# Patient Record
Sex: Male | Born: 1965 | Race: White | Hispanic: No | Marital: Married | Smoking: Former smoker
Health system: Southern US, Community
[De-identification: ages and names within clinical notes are randomized; demographics above are authoritative.]

## PROBLEM LIST (undated history)

## (undated) ENCOUNTER — Emergency Department: Discharge status: Absent without leave | Payer: Self-pay

## (undated) DIAGNOSIS — D499 Neoplasm of unspecified behavior of unspecified site: Secondary | ICD-10-CM

## (undated) DIAGNOSIS — R519 Headache, unspecified: Secondary | ICD-10-CM

## (undated) DIAGNOSIS — I10 Essential (primary) hypertension: Secondary | ICD-10-CM

## (undated) DIAGNOSIS — R51 Headache: Secondary | ICD-10-CM

## (undated) DIAGNOSIS — K219 Gastro-esophageal reflux disease without esophagitis: Secondary | ICD-10-CM

## (undated) DIAGNOSIS — I639 Cerebral infarction, unspecified: Secondary | ICD-10-CM

## (undated) DIAGNOSIS — Z8719 Personal history of other diseases of the digestive system: Secondary | ICD-10-CM

## (undated) DIAGNOSIS — M1990 Unspecified osteoarthritis, unspecified site: Secondary | ICD-10-CM

## (undated) DIAGNOSIS — Z8709 Personal history of other diseases of the respiratory system: Secondary | ICD-10-CM

## (undated) DIAGNOSIS — G4700 Insomnia, unspecified: Secondary | ICD-10-CM

## (undated) DIAGNOSIS — J45909 Unspecified asthma, uncomplicated: Secondary | ICD-10-CM

## (undated) DIAGNOSIS — M75100 Unspecified rotator cuff tear or rupture of unspecified shoulder, not specified as traumatic: Secondary | ICD-10-CM

## (undated) DIAGNOSIS — M19019 Primary osteoarthritis, unspecified shoulder: Secondary | ICD-10-CM

## (undated) HISTORY — PX: OTHER SURGICAL HISTORY: SHX169

## (undated) HISTORY — PX: CHOLECYSTECTOMY: SHX55

## (undated) HISTORY — PX: COLONOSCOPY: SHX174

---

## 2003-11-23 ENCOUNTER — Ambulatory Visit (HOSPITAL_BASED_OUTPATIENT_CLINIC_OR_DEPARTMENT_OTHER): Admission: RE | Admit: 2003-11-23 | Discharge: 2003-11-23 | Payer: Self-pay | Admitting: Orthopedic Surgery

## 2003-11-23 ENCOUNTER — Ambulatory Visit (HOSPITAL_COMMUNITY): Admission: RE | Admit: 2003-11-23 | Discharge: 2003-11-23 | Payer: Self-pay | Admitting: Orthopedic Surgery

## 2003-11-23 HISTORY — PX: SHOULDER ARTHROSCOPY: SHX128

## 2005-02-24 HISTORY — PX: TUMOR EXCISION: SHX421

## 2006-02-24 HISTORY — PX: HERNIA REPAIR: SHX51

## 2007-09-06 ENCOUNTER — Emergency Department (HOSPITAL_COMMUNITY): Admission: EM | Admit: 2007-09-06 | Discharge: 2007-09-06 | Payer: Self-pay | Admitting: Emergency Medicine

## 2009-03-14 ENCOUNTER — Ambulatory Visit (HOSPITAL_COMMUNITY): Admission: RE | Admit: 2009-03-14 | Discharge: 2009-03-14 | Payer: Self-pay | Admitting: Neurosurgery

## 2009-03-14 HISTORY — PX: LUMBAR LAMINECTOMY/DECOMPRESSION MICRODISCECTOMY: SHX5026

## 2010-05-07 ENCOUNTER — Ambulatory Visit
Admission: RE | Admit: 2010-05-07 | Discharge: 2010-05-07 | Disposition: A | Discharge status: Home / Self Care | Payer: BC Managed Care – PPO | Source: Ambulatory Visit | Attending: Family Medicine | Admitting: Family Medicine

## 2010-05-07 ENCOUNTER — Other Ambulatory Visit: Payer: Self-pay | Admitting: Family Medicine

## 2010-05-07 DIAGNOSIS — R52 Pain, unspecified: Secondary | ICD-10-CM

## 2010-05-10 ENCOUNTER — Other Ambulatory Visit (HOSPITAL_COMMUNITY): Payer: Self-pay | Admitting: Family Medicine

## 2010-05-10 DIAGNOSIS — M25552 Pain in left hip: Secondary | ICD-10-CM

## 2010-05-12 LAB — CBC
HCT: 45.3 % (ref 39.0–52.0)
Hemoglobin: 15.4 g/dL (ref 13.0–17.0)
RBC: 5.07 MIL/uL (ref 4.22–5.81)
WBC: 7.5 10*3/uL (ref 4.0–10.5)

## 2010-05-12 LAB — BASIC METABOLIC PANEL
Calcium: 9.7 mg/dL (ref 8.4–10.5)
GFR calc Af Amer: >60 mL/min (ref >60)
GFR calc non Af Amer: >60 mL/min (ref >60)
Potassium: 4.3 mEq/L (ref 3.5–5.1)
Sodium: 139 mEq/L (ref 135–145)

## 2010-05-17 ENCOUNTER — Encounter (HOSPITAL_COMMUNITY)
Admission: RE | Admit: 2010-05-17 | Discharge: 2010-05-17 | Disposition: A | Discharge status: Home / Self Care | Payer: BC Managed Care – PPO | Source: Ambulatory Visit | Attending: Family Medicine | Admitting: Family Medicine

## 2010-05-17 ENCOUNTER — Ambulatory Visit (HOSPITAL_COMMUNITY)
Admission: RE | Admit: 2010-05-17 | Discharge: 2010-05-17 | Disposition: A | Discharge status: Home / Self Care | Payer: BC Managed Care – PPO | Source: Ambulatory Visit | Attending: Family Medicine | Admitting: Family Medicine

## 2010-05-17 DIAGNOSIS — M899 Disorder of bone, unspecified: Secondary | ICD-10-CM | POA: Insufficient documentation

## 2010-05-17 DIAGNOSIS — M25551 Pain in right hip: Secondary | ICD-10-CM

## 2010-05-17 DIAGNOSIS — M25552 Pain in left hip: Secondary | ICD-10-CM

## 2010-05-17 MED ORDER — TECHNETIUM TC 99M MEDRONATE IV KIT
25.0000 | PACK | Freq: Once | INTRAVENOUS | Status: AC | PRN
  Administered 2010-05-17: 25 via INTRAVENOUS

## 2010-07-12 NOTE — Op Note (Signed)
NAMEOJANI, Scott Merritt              ACCOUNT NO.:  [id number]   MEDICAL RECORD NO.:  [id number]          PATIENT TYPE:  AMB   LOCATION:  DSC                          FACILITY:  [facility]   PHYSICIAN:  Katy Fitch. Sypher Montez Hageman., M.D.DATE OF BIRTH:  1965-04-13   DATE OF PROCEDURE:  11/23/2003  DATE OF DISCHARGE:                                 OPERATIVE REPORT   PREOPERATIVE DIAGNOSES:  1.  Chronic pain, right shoulder with presumed internal derangement, rule      out deep surface rotator cuff tear versus labral pathology.  2.  Chronic acromioclavicular joint degenerative arthritis with plain film      and MRI documentation of acromioclavicular joint degenerative change      with inferior projecting osteophytes causing stage II impingement, right      shoulder.   POSTOPERATIVE DIAGNOSES:  1.  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  2.  Chronic acromioclavicular joint arthropathy with stage II impingement.  3.  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   OPERATION PERFORMED:  1.  Diagnostic arthroscopy, right shoulder.  2.  Arthroscopic debridement of type 3 superior labral anterior posterior      lesion with partial debridement of biceps origin.  3.  Arthroscopic subacromial decompression with coracoacromial ligament      release, bursectomy and acromioplasty.  4.  Open resection of distal clavicle, i.e. Mumford procedure.   SURGEON:  Katy Fitch. Sypher, M.D.   ASSISTANT:  Jonni Sanger, P.A.   ANESTHESIA:  General endotracheal supplemented by interscalene block.   SUPERVISING ANESTHESIOLOGIST:  Germaine Pomfret, M.D.   INDICATIONS FOR PROCEDURE:  Scott Merritt is a 45 year old man who was  referred for evaluation and management of a painful right  shoulder.  He  reported a history of acutely straining his right shoulder, feeling a pop  and subsequently developing chronic crepitation and pain with rotation of  his shoulder for forward elevation.  Clinical examination suggested internal  derangement of the shoulder and plain films documented acromioclavicular  arthropathy and a type 2 acromion.  There was sclerosis of the greater  tuberosity suggestive of chronic impingement.  An MRI of the shoulder was  obtained at [facility].  This was interpreted to reveal significant edema  in the distal clavicle with acromioclavicular degenerative change.  There  were signs of chronic bursitis.  The biceps tendon was noted by the  radiologist to be normal.  Overread of the film suggested a possible  internal derangement and significant acromioclavicular arthropathy.  We  recommended diagnostic arthroscopy at this time with appropriate  intervention to follow.  Preoperatively, Mr. Silas was advised of the 47 potential risks and benefits of surgery including possible infection and  possible failure to resolve all the symptoms.  After informed consent during  a period when questions were invited and answered, he was brought to the  operating room  at this time.   DESCRIPTION OF PROCEDURE:  Karry Causer was brought to the operating room  and placed in supine position on the operating table.  Following  interscalene block in the holding area, anesthesia was satisfactory in the  right forequarter.  General orotracheal anesthesia was induced followed by  carefully positioning in the beach chair position with the aid of torso and  head holder designed for shoulder arthroscopy.  The entire right upper  extremity and forequarter were prepped with DuraPrep and draped with  impervious arthroscopy drapes.  The shoulder was distended with 20 mL of  sterile saline with a spinal needle brought in through the posterior portal  followed by placement of the  scope with blunt technique.  Diagnostic  arthroscopy revealed intact hyaline articular cartilage surfaces on the  humeral head and glenoid.  There was some partial thickness chondromalacia  of the central glenoid.  The biceps tendon was delaminated with about 60% of  the tendon involved in a type 3 SLAP lesion extending from 3 o'clock  anteriorly to approximately 9 o'clock posteriorly.  This was hanging free  within the joint and impinging between the humeral head and glenoid.  There  was moderate synovitis noted.  The deep surface of the rotator cuff was  inspected.  The tendons were found to have minimal degenerative change,  particularly the deep surface of the supraspinatus.  The subscapularis and  infraspinatus appeared normal.   An anterior portal was created under direct vision followed by use of a  suction shaver to debride the delaminated biceps tendon and degenerative  SLAP lesion.  A synovectomy was accomplished followed by hemostasis with  bipolar cautery.  The deep surface of the rotator cuff was debrided followed  by photographic documentation of its condition.  The arthroscopic equipment  was then removed from the glenohumeral joint and placed in the subacromial  space.  A florid bursitis was noted.  After bursectomy was accomplished, the  coracoacromial ligament was released with the cutting cautery followed by  release of the capsule of the acromioclavicular joint.  The distal clavicle  was quite degenerative.  The acromion was leveled to a type 1 morphology  with a suction bur followed by resection of the coracoacromial ligament over  a distance of approximately 1 cm.  Hemostasis was achieved with bipolar  cautery.  After bursectomy the cuff was inspected and found to be intact on  its bursal surface.  The scope was removed and we proceeded to resect the  distal 15 mm of clavicle through a 2 cm incision directly over the distal clavicle.  The clavicle was exposed  subperiosteally with release of the  capsular ligaments of the acromioclavicular joint.   The distal clavicle was severely degenerative.  Following satisfactory  resection of the clavicle, the muscle of the anterior trapezius and anterior  third of the deltoid was repaired with mattress suture of #2 FiberWire  closing the dead space and reinforcing the deltoid origin.  The wound was  then repaired with subdermal sutures of 2-0 Vicryl and intradermal 3-0  Prolene.  The scope was placed in the subacromial space and used to evacuate  hematoma followed by irrigation to remove all bone scrap.  Hemostasis was  achieved followed by removal of the arthroscopic equipment and closure of  the portals with a mattress suture of 3-0 Prolene.  There were no apparent  complications.  Mr. Matsuo tolerated surgery and anesthesia well.  The  patient was then transferred  to the recovery room with stable vital signs.  He will be discharged to the care of his family with prescriptions for  Dilaudid 2 mg one or two tablets by mouth every four to six hours as needed  for pain, Keflex 500 mg one by mouth every eight hours times four days as a  prophylactic  and Motrin 600 mg one by mouth every six hours as needed for  pain, 30 tablets without refill.       RVS/MEDQ  D:  11/23/2003  T:  11/23/2003  Job:  [id number]

## 2010-08-20 ENCOUNTER — Other Ambulatory Visit: Payer: Self-pay | Admitting: Neurosurgery

## 2010-08-20 DIAGNOSIS — M25551 Pain in right hip: Secondary | ICD-10-CM

## 2010-08-21 ENCOUNTER — Ambulatory Visit
Admission: RE | Admit: 2010-08-21 | Discharge: 2010-08-21 | Disposition: A | Discharge status: Home / Self Care | Payer: BC Managed Care – PPO | Source: Ambulatory Visit | Attending: Neurosurgery | Admitting: Neurosurgery

## 2010-08-21 DIAGNOSIS — M25551 Pain in right hip: Secondary | ICD-10-CM

## 2010-09-26 ENCOUNTER — Other Ambulatory Visit: Payer: Self-pay | Admitting: Neurological Surgery

## 2010-09-26 DIAGNOSIS — M5410 Radiculopathy, site unspecified: Secondary | ICD-10-CM

## 2010-10-02 ENCOUNTER — Ambulatory Visit
Admission: RE | Admit: 2010-10-02 | Discharge: 2010-10-02 | Disposition: A | Discharge status: Home / Self Care | Payer: BC Managed Care – PPO | Source: Ambulatory Visit | Attending: Neurological Surgery | Admitting: Neurological Surgery

## 2010-10-02 ENCOUNTER — Ambulatory Visit
Admission: RE | Admit: 2010-10-02 | Discharge: 2010-10-02 | Disposition: A | Discharge status: Home / Self Care | Payer: Self-pay | Source: Ambulatory Visit | Attending: Neurological Surgery | Admitting: Neurological Surgery

## 2010-10-02 DIAGNOSIS — M549 Dorsalgia, unspecified: Secondary | ICD-10-CM

## 2010-10-02 DIAGNOSIS — M5410 Radiculopathy, site unspecified: Secondary | ICD-10-CM

## 2010-10-02 MED ORDER — DIAZEPAM 2 MG PO TABS
10.0000 mg | ORAL_TABLET | Freq: Once | ORAL | Status: AC
  Administered 2010-10-02: 10 mg via ORAL

## 2010-10-02 MED ORDER — SODIUM CHLORIDE 0.9 % IV SOLN: 4.0000 mg | Freq: Four times a day (QID) | INTRAVENOUS | Status: DC | PRN

## 2010-10-02 MED ORDER — IOHEXOL 180 MG/ML  SOLN
16.0000 mL | Freq: Once | INTRAMUSCULAR | Status: AC | PRN
  Administered 2010-10-02: 16 mL via INTRATHECAL

## 2010-10-02 MED ORDER — OXYCODONE-ACETAMINOPHEN 5-325 MG PO TABS
2.0000 | ORAL_TABLET | Freq: Once | ORAL | Status: AC
  Administered 2010-10-02: 2 via ORAL

## 2010-10-02 NOTE — Discharge Instructions (Signed)

## 2010-10-02 NOTE — Progress Notes (Signed)
Resting quietly in nursing station waiting for CT.  Medicated for pain.  Eating snack.

## 2010-10-04 ENCOUNTER — Telehealth: Payer: Self-pay | Admitting: Interventional Radiology

## 2010-10-04 ENCOUNTER — Ambulatory Visit
Admission: RE | Admit: 2010-10-04 | Discharge: 2010-10-04 | Disposition: A | Discharge status: Home / Self Care | Payer: BC Managed Care – PPO | Source: Ambulatory Visit | Attending: Neurological Surgery | Admitting: Neurological Surgery

## 2010-10-04 ENCOUNTER — Other Ambulatory Visit: Payer: Self-pay | Admitting: Neurological Surgery

## 2010-10-04 DIAGNOSIS — G971 Other reaction to spinal and lumbar puncture: Secondary | ICD-10-CM

## 2010-10-04 MED ORDER — METHYLPREDNISOLONE ACETATE 40 MG/ML INJ SUSP (RADIOLOG: 120.0000 mg | Freq: Once | INTRAMUSCULAR | Status: DC

## 2010-10-04 MED ORDER — IOHEXOL 180 MG/ML  SOLN
1.0000 mL | Freq: Once | INTRAMUSCULAR | Status: AC | PRN
  Administered 2010-10-04: 1 mL via EPIDURAL

## 2010-10-04 NOTE — Telephone Encounter (Signed)
Pt c/lo post procedure headache that started last evening. Myelo on 8/8, excuse sent to pt's place of work for him to go home and be on bedrest for the next 24-48 hours, sent to [facility] [phone]). Explained that we could do bloodpatch  after getting an order from his dr. But he would still not be able to work sat, he chose to go home and bedrest all weekend.

## 2010-10-04 NOTE — Progress Notes (Signed)
20 cc blood drawn from right AC space w/o difficulty for blood patch.  jkl

## 2010-10-10 ENCOUNTER — Other Ambulatory Visit: Payer: Self-pay | Admitting: Neurosurgery

## 2010-10-10 DIAGNOSIS — I739 Peripheral vascular disease, unspecified: Secondary | ICD-10-CM

## 2010-10-11 ENCOUNTER — Other Ambulatory Visit: Payer: BC Managed Care – PPO

## 2010-10-14 ENCOUNTER — Other Ambulatory Visit: Payer: BC Managed Care – PPO

## 2010-11-21 LAB — CBC
HCT: 53.6 — ABNORMAL HIGH
Hemoglobin: 18.1 — ABNORMAL HIGH
MCHC: 33.7
MCV: 86.6
RDW: 13.4

## 2010-11-21 LAB — URINALYSIS, ROUTINE W REFLEX MICROSCOPIC
Ketones, ur: 15 — AB
Leukocytes, UA: NEGATIVE
Nitrite: NEGATIVE
Protein, ur: 30 — AB
Urobilinogen, UA: 0.2

## 2010-11-21 LAB — COMPREHENSIVE METABOLIC PANEL
Alkaline Phosphatase: 80
BUN: 18
Chloride: 101
Glucose, Bld: 112 — ABNORMAL HIGH
Potassium: 3.9
Total Bilirubin: 1.2
Total Protein: 8.4 — ABNORMAL HIGH

## 2010-11-21 LAB — POCT I-STAT, CHEM 8
Calcium, Ion: 1.09 — ABNORMAL LOW
Chloride: 107
Glucose, Bld: 122 — ABNORMAL HIGH
HCT: 57 — ABNORMAL HIGH

## 2010-11-21 LAB — DIFFERENTIAL
Basophils Absolute: 0.1
Eosinophils Absolute: 0.1
Eosinophils Relative: 1
Lymphocytes Relative: 12
Monocytes Absolute: 0.4

## 2010-11-21 LAB — LIPASE, BLOOD: Lipase: 91 — ABNORMAL HIGH

## 2012-03-27 DIAGNOSIS — I639 Cerebral infarction, unspecified: Secondary | ICD-10-CM

## 2012-03-27 HISTORY — DX: Cerebral infarction, unspecified: I63.9

## 2012-11-08 ENCOUNTER — Other Ambulatory Visit: Payer: Self-pay | Admitting: Orthopedic Surgery

## 2012-11-08 DIAGNOSIS — M25512 Pain in left shoulder: Secondary | ICD-10-CM

## 2012-11-15 ENCOUNTER — Ambulatory Visit
Admission: RE | Admit: 2012-11-15 | Discharge: 2012-11-15 | Disposition: A | Discharge status: Home / Self Care | Payer: BC Managed Care – PPO | Source: Ambulatory Visit | Attending: Orthopedic Surgery | Admitting: Orthopedic Surgery

## 2012-11-15 DIAGNOSIS — M25512 Pain in left shoulder: Secondary | ICD-10-CM

## 2012-11-24 DIAGNOSIS — M19019 Primary osteoarthritis, unspecified shoulder: Secondary | ICD-10-CM

## 2012-11-24 DIAGNOSIS — M75100 Unspecified rotator cuff tear or rupture of unspecified shoulder, not specified as traumatic: Secondary | ICD-10-CM

## 2012-11-24 HISTORY — DX: Primary osteoarthritis, unspecified shoulder: M19.019

## 2012-11-24 HISTORY — DX: Unspecified rotator cuff tear or rupture of unspecified shoulder, not specified as traumatic: M75.100

## 2012-11-25 ENCOUNTER — Encounter (HOSPITAL_BASED_OUTPATIENT_CLINIC_OR_DEPARTMENT_OTHER): Payer: Self-pay | Admitting: *Deleted

## 2012-11-25 NOTE — Pre-Procedure Instructions (Signed)
To come for BMET; EKG and echo req. from [facility].

## 2012-11-29 ENCOUNTER — Encounter (HOSPITAL_BASED_OUTPATIENT_CLINIC_OR_DEPARTMENT_OTHER)
Admission: RE | Admit: 2012-11-29 | Discharge: 2012-11-29 | Disposition: A | Discharge status: Home / Self Care | Payer: BC Managed Care – PPO | Source: Ambulatory Visit | Attending: Orthopedic Surgery | Admitting: Orthopedic Surgery

## 2012-11-29 LAB — BASIC METABOLIC PANEL
BUN: 14 mg/dL (ref 6–23)
Chloride: 100 mEq/L (ref 96–112)
GFR calc Af Amer: >90 mL/min (ref >90)
Glucose, Bld: 134 mg/dL — ABNORMAL HIGH (ref 70–99)
Potassium: 3.5 mEq/L (ref 3.5–5.1)

## 2012-11-30 ENCOUNTER — Other Ambulatory Visit: Payer: Self-pay | Admitting: Orthopedic Surgery

## 2012-12-01 NOTE — H&P (Signed)
Attilio Zeitler is an 47 y.o. male.   Chief Complaint: c/o chronic and progressive left shoulder pain and decreased ROM HPI: Scott Merritt presents for evaluation of a painful and weak left shoulder. Rondal is a well known patient who is s/p right shoulder arthroscopy and attention to his rotator cuff. He now presents with a 4 month history of progressive pain and weakness of his left shoulder. He has had a difficult year. In February he had an episode of scotomata followed by numbness and altered sensation on the left side of his body. He was seen at [facility] by Dr. Janan Halter in the department of neurology. He was noted to have small vessel disease and imaging that documented 2 cerebral vascular accidents. He is currently being treated with statins and aspirin 325 mg daily. He has a history of hypertension. He has a strong family history of cerebral vascular accidents with his father and grandfather experiencing CVA's.    Past Medical History  Diagnosis Date  . Stroke 03/2012    residual balance problems and memory problems  . Arthritis     left shoulder, hands  . Hypertension     under control with meds., has been on med. x 2 yr.  . Asthma     rare use of neb., none in > 1 yr.  . Rotator cuff tear 11/2012    left  . 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 History of bronchitis     Past Surgical History  Procedure Laterality Date  . Shoulder arthroscopy Right 11/23/2003  . Lumbar laminectomy/decompression microdiscectomy Right 03/14/2009    L5-S1  . 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 Hernia repair  2008    History reviewed. No pertinent family history. Social History:  reports that he has never smoked. His smokeless tobacco use includes Chew. He reports that he does not drink alcohol or use illicit drugs.  Allergies:  Allergies  Allergen Reactions  . Prednisone Other (See Comments)    BLURRED VISION  . Sudafed [Pseudoephedrine Hcl] Hives    No  prescriptions prior to admission    No results found for this or any previous visit (from the past 48 hour(s)).  No results found.   Pertinent items are noted in HPI.  Height 5' 10.5" (1.791 m), weight 102.059 kg (225 lb).  General appearance: alert Head: Normocephalic, without obvious abnormality Neck: supple, symmetrical, trachea midline Resp: clear to auscultation bilaterally Cardio: regular rate and rhythm GI: normal findings: bowel sounds normal Extremities: He has a normal gait and stance. He appears to have a normal affect and is answering questions with appropriate content. He has shoulder elevation 175 right and 150 left, external rotation at 90 degrees abduction 95 right and 60 left. He can internally rotate both shoulders to T10. He lacks 20 degrees of extension in the scapular plane on the left vs 0 on the right.   He has weakness of abduction scaption external rotation.   Plain films of the shoulder demonstrate degenerative change at the [facility] joint, an unfavorable acromial type II morphology. He does not have significant glenohumeral degenerative arthritis or considerable remodeling at the greater tuberosity.  MRI of his left shoulder:  this reveals a type II acromion, AC degenerative change and a bursal side rotator cuff tear. This is virtually identical to his right side predicament.    Pulses: 2+ and symmetric Skin: normal Neurologic: Grossly normal    Assessment/Plan Impression:  Left shoulder RC tear with impingement syndrome and bursal side RC tear  Plan: To the OR for left SA with SAD/DCR and RC repair as needed.The procedure, risks,benefits and post-op course were discussed with the patient at length and they were in agreement with the plan.  DASNOIT,Shonnie Poudrier J 12/01/2012, 1:19 PM   H&P documentation: 12/02/2012  -History and Physical Reviewed  -Patient has been re-examined  -No change in the plan of care  Wyn Forster, MD

## 2012-12-02 ENCOUNTER — Encounter (HOSPITAL_BASED_OUTPATIENT_CLINIC_OR_DEPARTMENT_OTHER): Payer: BC Managed Care – PPO | Admitting: Anesthesiology

## 2012-12-02 ENCOUNTER — Ambulatory Visit (HOSPITAL_BASED_OUTPATIENT_CLINIC_OR_DEPARTMENT_OTHER)
Admission: RE | Admit: 2012-12-02 | Discharge: 2012-12-02 | Disposition: A | Discharge status: Home / Self Care | Payer: BC Managed Care – PPO | Source: Ambulatory Visit | Attending: Orthopedic Surgery | Admitting: Orthopedic Surgery

## 2012-12-02 ENCOUNTER — Ambulatory Visit (HOSPITAL_BASED_OUTPATIENT_CLINIC_OR_DEPARTMENT_OTHER): Payer: BC Managed Care – PPO | Admitting: Anesthesiology

## 2012-12-02 ENCOUNTER — Encounter (HOSPITAL_BASED_OUTPATIENT_CLINIC_OR_DEPARTMENT_OTHER)
Admission: RE | Disposition: A | Discharge status: Home / Self Care | Payer: Self-pay | Source: Ambulatory Visit | Attending: Orthopedic Surgery

## 2012-12-02 ENCOUNTER — Encounter (HOSPITAL_BASED_OUTPATIENT_CLINIC_OR_DEPARTMENT_OTHER): Payer: Self-pay | Admitting: *Deleted

## 2012-12-02 DIAGNOSIS — I10 Essential (primary) hypertension: Secondary | ICD-10-CM | POA: Insufficient documentation

## 2012-12-02 DIAGNOSIS — M25819 Other specified joint disorders, unspecified shoulder: Secondary | ICD-10-CM | POA: Insufficient documentation

## 2012-12-02 DIAGNOSIS — M7500 Adhesive capsulitis of unspecified shoulder: Secondary | ICD-10-CM | POA: Insufficient documentation

## 2012-12-02 DIAGNOSIS — Z01812 Encounter for preprocedural laboratory examination: Secondary | ICD-10-CM | POA: Insufficient documentation

## 2012-12-02 DIAGNOSIS — X58XXXA Exposure to other specified factors, initial encounter: Secondary | ICD-10-CM | POA: Insufficient documentation

## 2012-12-02 DIAGNOSIS — S43439A Superior glenoid labrum lesion of unspecified shoulder, initial encounter: Secondary | ICD-10-CM | POA: Insufficient documentation

## 2012-12-02 DIAGNOSIS — M719 Bursopathy, unspecified: Secondary | ICD-10-CM | POA: Insufficient documentation

## 2012-12-02 DIAGNOSIS — Z8673 Personal history of transient ischemic attack (TIA), and cerebral infarction without residual deficits: Secondary | ICD-10-CM | POA: Insufficient documentation

## 2012-12-02 DIAGNOSIS — M67919 Unspecified disorder of synovium and tendon, unspecified shoulder: Secondary | ICD-10-CM | POA: Insufficient documentation

## 2012-12-02 DIAGNOSIS — M24119 Other articular cartilage disorders, unspecified shoulder: Secondary | ICD-10-CM | POA: Insufficient documentation

## 2012-12-02 DIAGNOSIS — Z5333 Arthroscopic surgical procedure converted to open procedure: Secondary | ICD-10-CM | POA: Insufficient documentation

## 2012-12-02 HISTORY — DX: Unspecified osteoarthritis, unspecified site: M19.90

## 2012-12-02 HISTORY — DX: Unspecified asthma, uncomplicated: J45.909

## 2012-12-02 HISTORY — DX: Primary osteoarthritis, unspecified shoulder: M19.019

## 2012-12-02 HISTORY — DX: Cerebral infarction, unspecified: I63.9

## 2012-12-02 HISTORY — DX: Essential (primary) hypertension: I10

## 2012-12-02 HISTORY — DX: Personal history of other diseases of the respiratory system: Z87.09

## 2012-12-02 HISTORY — PX: SHOULDER ARTHROSCOPY WITH ROTATOR CUFF REPAIR AND SUBACROMIAL DECOMPRESSION: SHX5686

## 2012-12-02 HISTORY — DX: Unspecified rotator cuff tear or rupture of unspecified shoulder, not specified as traumatic: M75.100

## 2012-12-02 SURGERY — SHOULDER ARTHROSCOPY WITH ROTATOR CUFF REPAIR AND SUBACROMIAL DECOMPRESSION
Anesthesia: General | Site: Shoulder | Laterality: Left | Wound class: Clean

## 2012-12-02 MED ORDER — FENTANYL CITRATE 0.05 MG/ML IJ SOLN
50.0000 ug | INTRAMUSCULAR | Status: DC | PRN
  Administered 2012-12-02: 100 ug via INTRAVENOUS

## 2012-12-02 MED ORDER — CEFAZOLIN SODIUM-DEXTROSE 2-3 GM-% IV SOLR
2.0000 g | Freq: Once | INTRAVENOUS | Status: AC
  Administered 2012-12-02: 2 g via INTRAVENOUS

## 2012-12-02 MED ORDER — OXYCODONE HCL 5 MG/5ML PO SOLN: 5.0000 mg | Freq: Once | ORAL | Status: DC | PRN

## 2012-12-02 MED ORDER — LIDOCAINE HCL 1 % IJ SOLN
INTRAMUSCULAR | Status: DC | PRN
  Administered 2012-12-02: 2 mL via INTRADERMAL

## 2012-12-02 MED ORDER — MIDAZOLAM HCL 2 MG/2ML IJ SOLN
1.0000 mg | INTRAMUSCULAR | Status: DC | PRN
  Administered 2012-12-02: 2 mg via INTRAVENOUS

## 2012-12-02 MED ORDER — LIDOCAINE HCL (CARDIAC) 20 MG/ML IV SOLN
INTRAVENOUS | Status: DC | PRN
  Administered 2012-12-02: 50 mg via INTRAVENOUS

## 2012-12-02 MED ORDER — OXYCODONE HCL 5 MG PO TABS: 5.0000 mg | ORAL_TABLET | Freq: Once | ORAL | Status: DC | PRN

## 2012-12-02 MED ORDER — ROPIVACAINE HCL 5 MG/ML IJ SOLN
INTRAMUSCULAR | Status: DC | PRN
  Administered 2012-12-02: 25 mL

## 2012-12-02 MED ORDER — FENTANYL CITRATE 0.05 MG/ML IJ SOLN
INTRAMUSCULAR | Status: DC | PRN
  Administered 2012-12-02: 100 ug via INTRAVENOUS

## 2012-12-02 MED ORDER — HYDROMORPHONE HCL PF 1 MG/ML IJ SOLN
0.2500 mg | INTRAMUSCULAR | Status: DC | PRN
  Administered 2012-12-02: 0.5 mg via INTRAVENOUS

## 2012-12-02 MED ORDER — DEXAMETHASONE SODIUM PHOSPHATE 4 MG/ML IJ SOLN
INTRAMUSCULAR | Status: DC | PRN
  Administered 2012-12-02: 8 mg via INTRAVENOUS

## 2012-12-02 MED ORDER — HYDROMORPHONE HCL 2 MG PO TABS: ORAL_TABLET | ORAL | Status: DC

## 2012-12-02 MED ORDER — ONDANSETRON HCL 4 MG/2ML IJ SOLN
INTRAMUSCULAR | Status: DC | PRN
  Administered 2012-12-02: 4 mg via INTRAMUSCULAR

## 2012-12-02 MED ORDER — METOCLOPRAMIDE HCL 5 MG/ML IJ SOLN: 10.0000 mg | Freq: Once | INTRAMUSCULAR | Status: DC | PRN

## 2012-12-02 MED ORDER — SODIUM CHLORIDE 0.9 % IR SOLN
Status: DC | PRN
  Administered 2012-12-02: 5000 mL

## 2012-12-02 MED ORDER — PROPOFOL 10 MG/ML IV BOLUS
INTRAVENOUS | Status: DC | PRN
  Administered 2012-12-02: 200 mg via INTRAVENOUS

## 2012-12-02 MED ORDER — SUCCINYLCHOLINE CHLORIDE 20 MG/ML IJ SOLN
INTRAMUSCULAR | Status: DC | PRN
  Administered 2012-12-02: 100 mg via INTRAVENOUS

## 2012-12-02 MED ORDER — MIDAZOLAM HCL 2 MG/ML PO SYRP: 12.0000 mg | ORAL_SOLUTION | Freq: Once | ORAL | Status: DC | PRN

## 2012-12-02 MED ORDER — CEPHALEXIN 500 MG PO CAPS: 500.0000 mg | ORAL_CAPSULE | Freq: Three times a day (TID) | ORAL | Status: DC

## 2012-12-02 MED ORDER — LACTATED RINGERS IV SOLN
INTRAVENOUS | Status: DC
  Administered 2012-12-02 (×2): via INTRAVENOUS

## 2012-12-02 MED ORDER — LABETALOL HCL 5 MG/ML IV SOLN
INTRAVENOUS | Status: DC | PRN
  Administered 2012-12-02 (×2): 5 mg via INTRAVENOUS

## 2012-12-02 SURGICAL SUPPLY — 79 items
ANCHOR PEEK 4.75X19.1 SWLK C (Anchor) ×2 IMPLANT
BANDAGE ADHESIVE 1X3 (GAUZE/BANDAGES/DRESSINGS) IMPLANT
BLADE AVERAGE 25X9 (BLADE) IMPLANT
BLADE CUTTER MENIS 5.5 (BLADE) IMPLANT
BLADE SURG 15 STRL LF DISP TIS (BLADE) ×2 IMPLANT
BLADE SURG 15 STRL SS (BLADE) ×2
BUR EGG 3PK/BX (BURR) IMPLANT
BUR OVAL 6.0 (BURR) ×2 IMPLANT
CANISTER OMNI JUG 16 LITER (MISCELLANEOUS) ×4 IMPLANT
CANISTER SUCTION 2500CC (MISCELLANEOUS) IMPLANT
CANNULA TWIST IN 8.25X7CM (CANNULA) IMPLANT
CLEANER CAUTERY TIP 5X5 PAD (MISCELLANEOUS) IMPLANT
CLOTH BEACON ORANGE TIMEOUT ST (SAFETY) IMPLANT
CUTTER MENISCUS  4.2MM (BLADE) ×1
CUTTER MENISCUS 4.2MM (BLADE) ×1 IMPLANT
DECANTER SPIKE VIAL GLASS SM (MISCELLANEOUS) IMPLANT
DRAPE INCISE IOBAN 66X45 STRL (DRAPES) ×2 IMPLANT
DRAPE STERI 35X30 U-POUCH (DRAPES) ×2 IMPLANT
DRAPE SURG 17X23 STRL (DRAPES) ×2 IMPLANT
DRAPE U-SHAPE 47X51 STRL (DRAPES) ×2 IMPLANT
DRAPE U-SHAPE 76X120 STRL (DRAPES) ×4 IMPLANT
DRSG PAD ABDOMINAL 8X10 ST (GAUZE/BANDAGES/DRESSINGS) ×2 IMPLANT
DURAPREP 26ML APPLICATOR (WOUND CARE) ×2 IMPLANT
ELECT REM PT RETURN 9FT ADLT (ELECTROSURGICAL) ×2
ELECTRODE REM PT RTRN 9FT ADLT (ELECTROSURGICAL) ×1 IMPLANT
GLOVE BIOGEL M STRL SZ7.5 (GLOVE) ×2 IMPLANT
GLOVE BIOGEL PI IND STRL 7.0 (GLOVE) ×2 IMPLANT
GLOVE BIOGEL PI IND STRL 8 (GLOVE) ×2 IMPLANT
GLOVE BIOGEL PI INDICATOR 7.0 (GLOVE) ×2
GLOVE BIOGEL PI INDICATOR 8 (GLOVE) ×2
GLOVE ECLIPSE 6.5 STRL STRAW (GLOVE) ×2 IMPLANT
GLOVE ORTHO TXT STRL SZ7.5 (GLOVE) ×6 IMPLANT
GOWN BRE IMP PREV XXLGXLNG (GOWN DISPOSABLE) ×2 IMPLANT
GOWN STRL REIN 2XL XLG LVL4 (GOWN DISPOSABLE) ×2 IMPLANT
IV NS IRRIG 3000ML ARTHROMATIC (IV SOLUTION) ×4 IMPLANT
NDL SUT 6 .5 CRC .975X.05 MAYO (NEEDLE) ×1 IMPLANT
NEEDLE MAYO TAPER (NEEDLE) ×1
NEEDLE MINI RC 24MM (NEEDLE) IMPLANT
NEEDLE SCORPION (NEEDLE) ×2 IMPLANT
PACK ARTHROSCOPY DSU (CUSTOM PROCEDURE TRAY) ×2 IMPLANT
PACK BASIN DAY SURGERY FS (CUSTOM PROCEDURE TRAY) ×2 IMPLANT
PAD CLEANER CAUTERY TIP 5X5 (MISCELLANEOUS)
PASSER SUT SWANSON 36MM LOOP (INSTRUMENTS) IMPLANT
PENCIL BUTTON HOLSTER BLD 10FT (ELECTRODE) ×2 IMPLANT
SLEEVE SCD COMPRESS KNEE MED (MISCELLANEOUS) ×2 IMPLANT
SLING ARM FOAM STRAP LRG (SOFTGOODS) IMPLANT
SLING ARM FOAM STRAP MED (SOFTGOODS) IMPLANT
SLING ARM FOAM STRAP XLG (SOFTGOODS) ×2 IMPLANT
SPONGE GAUZE 4X4 12PLY (GAUZE/BANDAGES/DRESSINGS) ×2 IMPLANT
SPONGE LAP 4X18 X RAY DECT (DISPOSABLE) ×4 IMPLANT
STRIP CLOSURE SKIN 1/2X4 (GAUZE/BANDAGES/DRESSINGS) ×2 IMPLANT
SUCTION FRAZIER TIP 10 FR DISP (SUCTIONS) IMPLANT
SUT ETHIBOND 2 OS 4 DA (SUTURE) IMPLANT
SUT ETHILON 4 0 PS 2 18 (SUTURE) IMPLANT
SUT FIBERWIRE #2 38 T-5 BLUE (SUTURE) ×4
SUT FIBERWIRE 3-0 18 TAPR NDL (SUTURE)
SUT PROLENE 1 CT (SUTURE) IMPLANT
SUT PROLENE 3 0 PS 2 (SUTURE) ×2 IMPLANT
SUT TIGER TAPE 7 IN WHITE (SUTURE) IMPLANT
SUT VIC AB 0 CT1 27 (SUTURE)
SUT VIC AB 0 CT1 27XBRD ANBCTR (SUTURE) IMPLANT
SUT VIC AB 0 SH 27 (SUTURE) ×2 IMPLANT
SUT VIC AB 2-0 SH 27 (SUTURE) ×1
SUT VIC AB 2-0 SH 27XBRD (SUTURE) ×1 IMPLANT
SUT VIC AB 3-0 SH 27 (SUTURE)
SUT VIC AB 3-0 SH 27X BRD (SUTURE) IMPLANT
SUT VIC AB 3-0 X1 27 (SUTURE) IMPLANT
SUTURE FIBERWR #2 38 T-5 BLUE (SUTURE) ×2 IMPLANT
SUTURE FIBERWR 3-0 18 TAPR NDL (SUTURE) IMPLANT
SYR 3ML 23GX1 SAFETY (SYRINGE) IMPLANT
SYR BULB 3OZ (MISCELLANEOUS) IMPLANT
TAPE FIBER 2MM 7IN #2 BLUE (SUTURE) IMPLANT
TAPE PAPER 3X10 WHT MICROPORE (GAUZE/BANDAGES/DRESSINGS) ×2 IMPLANT
TOWEL OR 17X24 6PK STRL BLUE (TOWEL DISPOSABLE) ×2 IMPLANT
TUBE CONNECTING 20X1/4 (TUBING) ×2 IMPLANT
TUBING ARTHROSCOPY IRRIG 16FT (MISCELLANEOUS) ×2 IMPLANT
WAND STAR VAC 90 (SURGICAL WAND) ×2 IMPLANT
WATER STERILE IRR 1000ML POUR (IV SOLUTION) ×2 IMPLANT
YANKAUER SUCT BULB TIP NO VENT (SUCTIONS) ×2 IMPLANT

## 2012-12-02 NOTE — Brief Op Note (Signed)
12/02/2012  3:16 PM  PATIENT:  Scott Merritt  48 y.o. male  PRE-OPERATIVE DIAGNOSIS:  LEFT CUFF TEAR (bursal side per MRI), unfavorable acromial contour  POST-OPERATIVE DIAGNOSIS:  Left Shoulder Type 4 SLAP Tear, degenerative cuff tear of anterior supraspinatus, unfavorable acromial contour  PROCEDURE:  Procedure(s): LEFT SHOULDER ARTHROSCOPY WITH SUBACROMIAL DECOMPRESSION, OPEN BICEPS TENODESIS, REPAIR OF PITA TEAR, DEBRIDEMENT OF SLAP TEAR (Left)  SURGEON:  Surgeon(s) and Role:    * Wyn Forster., MD - Primary  PHYSICIAN ASSISTANT:   ASSISTANTS: Mallory Shirk.A-C   ANESTHESIA:   general  EBL:  Total I/O In: 1000 [I.V.:1000] Out: -   BLOOD ADMINISTERED:none  DRAINS: none   LOCAL MEDICATIONS USED:  ROPIVICAINE PLEXUS BLOCK  SPECIMEN:  No Specimen  DISPOSITION OF SPECIMEN:  N/A  COUNTS:  YES  TOURNIQUET:  * No tourniquets in log *  DICTATION: .Other Dictation: Dictation Number [id number]  PLAN OF CARE: Discharge to home after PACU  PATIENT DISPOSITION:  PACU - hemodynamically stable.   Delay start of Pharmacological VTE agent (>24hrs) due to surgical blood loss or risk of bleeding: not applicable

## 2012-12-02 NOTE — Anesthesia Preprocedure Evaluation (Signed)
Anesthesia Evaluation  Patient identified by MRN, date of birth, ID band Patient awake    Reviewed: Allergy & Precautions, H&P , NPO status , Patient's Chart, lab work & pertinent test results, reviewed documented beta blocker date and time   Airway Mallampati: II TM Distance: >3 FB Neck ROM: full    Dental   Pulmonary asthma ,  breath sounds clear to auscultation        Cardiovascular hypertension, On Medications and On Home Beta Blockers Rhythm:regular     Neuro/Psych  Neuromuscular disease CVA negative psych ROS   GI/Hepatic negative GI ROS, Neg liver ROS,   Endo/Other  negative endocrine ROS  Renal/GU negative Renal ROS  negative genitourinary   Musculoskeletal   Abdominal   Peds  Hematology negative hematology ROS (+)   Anesthesia Other Findings See surgeon's H&P   Reproductive/Obstetrics negative OB ROS                           Anesthesia Physical Anesthesia Plan  ASA: III  Anesthesia Plan: General   Post-op Pain Management:    Induction: Intravenous  Airway Management Planned: Oral ETT  Additional Equipment:   Intra-op Plan:   Post-operative Plan: Extubation in OR  Informed Consent: I have reviewed the patients History and Physical, chart, labs and discussed the procedure including the risks, benefits and alternatives for the proposed anesthesia with the patient or authorized representative who has indicated his/her understanding and acceptance.   Dental Advisory Given  Plan Discussed with: CRNA and Surgeon  Anesthesia Plan Comments:         Anesthesia Quick Evaluation

## 2012-12-02 NOTE — Anesthesia Postprocedure Evaluation (Signed)
  Anesthesia Post-op Note  Patient: Scott Merritt  Procedure(s) Performed: Procedure(s): LEFT SHOULDER ARTHROSCOPY WITH SUBACROMIAL DECOMPRESSION, OPEN BICEPS TENODESIS, REPAIR OF PITA TEAR, DEBRIDEMENT OF SLAP TEAR (Left)  Patient Location: PACU  Anesthesia Type:GA combined with regional for post-op pain  Level of Consciousness: awake and alert   Airway and Oxygen Therapy: Patient Spontanous Breathing  Post-op Pain: mild  Post-op Assessment: Post-op Vital signs reviewed, Patient's Cardiovascular Status Stable, Respiratory Function Stable, Patent Airway and No signs of Nausea or vomiting  Post-op Vital Signs: Reviewed and stable  Complications: No apparent anesthesia complications

## 2012-12-02 NOTE — Op Note (Signed)
[id number]

## 2012-12-02 NOTE — Anesthesia Procedure Notes (Addendum)
Anesthesia Regional Block:  Interscalene brachial plexus block  Pre-Anesthetic Checklist: ,, timeout performed, Correct Patient, Correct Site, Correct Laterality, Correct Procedure, Correct Position, site marked, Risks and benefits discussed,  Surgical consent,  Pre-op evaluation,  At surgeon's request and post-op pain management  Laterality: Left  Prep: chloraprep       Needles:   Needle Type: Other     Needle Length: 9cm  Needle Gauge: 21 and 21 G    Additional Needles:  Procedures: ultrasound guided (picture in chart) Interscalene brachial plexus block Narrative:  Start time: 12/02/2012 11:26 AM End time: 12/02/2012 11:33 AM Injection made incrementally with aspirations every 5 mL.  Performed by: Personally  Anesthesiologist: Aldona Lento, MD  Additional Notes: Ultrasound guidance used to: id relevant anatomy, confirm needle position, local anesthetic spread, avoidance of vascular puncture. Picture saved. No complications. Block performed personally by Janetta Hora. Gelene Mink, MD    Interscalene brachial plexus block Procedure Name: Intubation Performed by: Lance Coon Pre-anesthesia Checklist: Patient identified, Emergency Drugs available, Suction available and Patient being monitored Patient Re-evaluated:Patient Re-evaluated prior to inductionOxygen Delivery Method: Circle System Utilized Preoxygenation: Pre-oxygenation with 100% oxygen Intubation Type: IV induction Ventilation: Mask ventilation without difficulty Tube type: Oral Tube size: 7.0 mm Number of attempts: 1 Airway Equipment and Method: stylet,  oral airway and Video-laryngoscopy Placement Confirmation: ETT inserted through vocal cords under direct vision,  positive ETCO2 and breath sounds checked- equal and bilateral Tube secured with: Tape Dental Injury: Teeth and Oropharynx as per pre-operative assessment

## 2012-12-02 NOTE — Progress Notes (Signed)
Assisted Dr. Frederick with left, ultrasound guided, interscalene  block. Side rails up, monitors on throughout procedure. See vital signs in flow sheet. Tolerated Procedure well. 

## 2012-12-02 NOTE — Transfer of Care (Signed)
Immediate Anesthesia Transfer of Care Note  Patient: Scott Merritt  Procedure(s) Performed: Procedure(s): LEFT SHOULDER ARTHROSCOPY WITH SUBACROMIAL DECOMPRESSION, OPEN BICEPS TENODESIS, REPAIR OF PITA TEAR, DEBRIDEMENT OF SLAP TEAR (Left)  Patient Location: PACU  Anesthesia Type:GA combined with regional for post-op pain  Level of Consciousness: sedated  Airway & Oxygen Therapy: Patient Spontanous Breathing and Patient connected to face mask oxygen  Post-op Assessment: Report given to PACU RN and Post -op Vital signs reviewed and stable  Post vital signs: Reviewed and stable  Complications: No apparent anesthesia complications

## 2012-12-03 NOTE — Op Note (Signed)
Scott Merritt              ACCOUNT NO.:  [id number]  MEDICAL RECORD NO.:  [id number]  LOCATION:                                 FACILITY:  PHYSICIAN:  Scott Fitch. Torrian Canion, M.D.      DATE OF BIRTH:  DATE OF PROCEDURE:  12/02/2012 DATE OF DISCHARGE:                              OPERATIVE REPORT   PREOPERATIVE DIAGNOSIS:  MRI documented bursal side rotator cuff tear with unfavorable acromioclavicular anatomy and possible labral tear.  POSTOPERATIVE DIAGNOSES: 1. Type 2/4 displaced SLAP tear with unstable biceps origin and     degenerative labral tearing anteriorly and posteriorly. 2. Unfavorable acromial anatomy. 3. Degenerative supraspinatus rotator cuff tendon tear including a mid     substance PITA tear of the anterior supraspinatus.  OPERATION: 1. Diagnostic arthroscopy of left glenohumeral joint. 2. Arthroscopic debridement of labrum and biceps tenotomy with general     debridement of labrum anteriorly, inferiorly, and posteriorly. 3. Arthroscopic subacromial decompression with bursectomy, release of     coracoacromial ligament, and acromioplasty.  The [facility] joint was not     problematic. 4. Open biceps tenodesis. 5. Open repair of supraspinatus PITA tear to decorticated greater     tuberosity.  OPERATING SURGEON:  Scott Merritt, M.D.  ASSISTANT:  Marveen Reeks. Dasnoit, PA-C.  ANESTHESIA:  General by endotracheal technique supplemented by a left ropivacaine plexus block.  SUPERVISING ANESTHESIOLOGIST:  Janetta Hora. Gelene Mink, M.D.  INDICATIONS:  Scott Merritt is a 47 year old gentleman, well acquainted with our practice.  He is status post repair of his right rotator cuff two years prior.  He returned with similar symptoms on the left side. Clinical examination showed signs of mild adhesive capsulitis and weakness of scaption, abduction, and painful impingement maneuver.  Based on his history on the right side, the nature of his work, and his weakness he was sent  for an MRI.  The MRI was interpreted by Dr. Jena Gauss, attending radiologist, to reveal a partial-thickness rotator cuff tear in the anterior supraspinatus.  Dr. Jena Gauss called an intact labrum and biceps complex.  Scott Merritt was noted to have unfavorable acromial anatomy and some mild degenerative change of AC joint.  Due to his persistent pain, he requested that we proceed with intervention early rather than later.  I advised him to undergo diagnostic arthroscopy, subacromial decompression, anticipation of possible labral debridement, and anticipation of probable small rotator cuff repair.  Preoperatively, he was interviewed by Dr. Gelene Mink of Anesthesia, who recommended a perioperative ropivacaine plexus block for comfort.  This was placed with ultrasound guidance without complication in the holding area.  Scott Merritt had excellent anesthesia of his entire left upper extremity and forequarter.  After informed consent, he was brought to the operating room at this time.  DESCRIPTION OF PROCEDURE:  Scott Merritt was brought to room 1 of the [facility] and placed supine position on the operating table.  Following the induction of general anesthesia by endotracheal technique under Dr. Thornton Dales direct supervision, Scott Merritt was carefully positioned in the beach-chair position with the aid of a torso and head holder designed for shoulder arthroscopy.  The entire left upper extremity and forequarter were prepped with  DuraPrep and draped with impervious arthroscopy drapes.  A 2 g of Ancef was administered as an IV prophylactic antibiotic.  Passive compression devices were applied in the calves and all bony prominences padded and care taken to assure that there were no tight blankets or belts around the abdomen/chest.  Following the routine surgical time-out, the shoulder was instrumented through a standard posterior viewing portal with a blunt trocar. Diagnostic  arthroscopy immediately revealed a major biceps/labral pathology with a severe anterior type 2 SLAP with a peel back all way to the neck of the glenoid and a bucket-handle tear of the labrum posteriorly.  It was curious that this was not recognized on the MRI which was obtained on November 15, 2012.  The deep surface of the rotator cuff was intact including the subscapularis, supraspinatus, infraspinatus and teres minor.  The labrum was inspected and found to be degenerative anteriorly and at approximately 7-9 o'clock posteriorly, there was a significant degenerative portion of the labrum with fragments free in the joint. The labrum was debrided to a smooth margin circumferentially and the bucket handle portion of the long head of the biceps/posterior slap was debrided completely.  At age 28 given his medical status and activity level, I did not feel it was appropriate to try to perform a SLAP repair on Scott Merritt.  Therefore, plan was to perform a biceps tenodesis and repair of the rotator cuff.  A suction shaver was used to perform debridement of minor adhesive capsulitis granulation tissue from the anterior shoulder capsule, anterior glenohumeral ligaments and subscapularis followed by thinning of the biceps tendon at its origin to less than 5% of its thickness. The scope was then removed from the glenohumeral joint and placed in the subacromial space.  Florid bursitis was noted.  After thorough bursectomy, the cuff was carefully inspected and palpated with a nerve hook.  The cuff looked intact but the supraspinatus, central and anterior portion was very degenerative with fibrillation of the fibers.  By palpation with a nerve hook, I could palpate a core PITA type tear that was unstable.  This was directly posterior to the long head of the biceps.  The scope was replaced in the joint and the spinal needle was used to mark the exact position of the long head of the biceps.  We  then performed a small 2.5 cm muscle-splitting incision anteriorly allowing [place] to palpate the long head of the biceps and perform a release of the intertubercular ligament.  The long head of the biceps was gathered with a right-angle hemostat with a cotton tape and a Krackow type stitch was placed with at least 6 passes.  The articular portion of the biceps was then ruptured by traction with a Kocher clamp, and the redundant biceps resected with scalpel.  We then opened the pita tear, noted a significant osteophyte at the anterior greater tuberosity, debrided the osteophyte and decorticated the greater tuberosity with a large rongeur followed by placement of a grasping suture in the supraspinatus tendon.  The supraspinatus tendon was repaired, closing the pita tear to decorticate the greater tuberosity. We performed a biceps tenodesis and repair of the supraspinatus with a single swivel lock with the mattress suture from the supraspinatus and the tenodesis both placed in the intertubercular groove with the swivel lock countersunk approximately 3 mm.  A very smooth profile repair was achieved.  The fiberwire suture in the swivel lock was then used to perform a finishing running mattress suture in the supraspinatus  and bursal surface of the tendons.  A hand rasp was used to further feather the edge of the anterior acromion.  Hemostasis was achieved with a bipolar cautery and the Bovie. Following irrigation, the wound was closed with mattress suture of 0 Vicryl in the deltoid followed by repair of the skin with subcutaneous 2- 0 Vicryl and intradermal 3-0 Prolene.  The portals were repaired with intradermal 3-0 Prolene.  Scott Merritt was placed in a sling, awakened, and transferred to the recovery room with stable vital signs.  He will be discharged in care of his wife with prescriptions for Dilaudid 2 mg 1 or 2 tablets p.o. q.4-6 hours p.r.n. pain, 30 tablets without refill, also  Keflex 500 mg 1 p.o. q.8 hours x4 days as prophylactic antibiotic.     Scott Fitch Reannah Totten, M.D.     RVS/MEDQ  D:  12/02/2012  T:  12/03/2012  Job:  [id number]  cc:   Burnell Blanks, MD

## 2012-12-06 ENCOUNTER — Encounter (HOSPITAL_BASED_OUTPATIENT_CLINIC_OR_DEPARTMENT_OTHER): Payer: Self-pay | Admitting: Orthopedic Surgery

## 2013-09-07 ENCOUNTER — Other Ambulatory Visit (HOSPITAL_COMMUNITY): Payer: Self-pay | Admitting: Orthopedic Surgery

## 2013-09-07 DIAGNOSIS — M25512 Pain in left shoulder: Secondary | ICD-10-CM

## 2013-09-09 ENCOUNTER — Ambulatory Visit (HOSPITAL_COMMUNITY)
Admission: RE | Admit: 2013-09-09 | Discharge: 2013-09-09 | Disposition: A | Discharge status: Home / Self Care | Payer: BC Managed Care – PPO | Source: Ambulatory Visit | Attending: Orthopedic Surgery | Admitting: Orthopedic Surgery

## 2013-09-09 DIAGNOSIS — M25512 Pain in left shoulder: Secondary | ICD-10-CM

## 2013-09-09 DIAGNOSIS — M25519 Pain in unspecified shoulder: Secondary | ICD-10-CM | POA: Insufficient documentation

## 2013-09-09 DIAGNOSIS — M79609 Pain in unspecified limb: Secondary | ICD-10-CM | POA: Insufficient documentation

## 2014-11-07 DIAGNOSIS — I6300 Cerebral infarction due to thrombosis of unspecified precerebral artery: Secondary | ICD-10-CM | POA: Insufficient documentation

## 2016-03-20 ENCOUNTER — Other Ambulatory Visit: Payer: Self-pay | Admitting: Orthopedic Surgery

## 2016-03-20 DIAGNOSIS — M545 Low back pain: Secondary | ICD-10-CM

## 2016-03-21 ENCOUNTER — Other Ambulatory Visit: Payer: Self-pay | Admitting: Orthopedic Surgery

## 2016-03-21 DIAGNOSIS — M533 Sacrococcygeal disorders, not elsewhere classified: Secondary | ICD-10-CM

## 2016-03-28 ENCOUNTER — Ambulatory Visit
Admission: RE | Admit: 2016-03-28 | Discharge: 2016-03-28 | Disposition: A | Discharge status: Home / Self Care | Payer: BC Managed Care – PPO | Source: Ambulatory Visit | Attending: Orthopedic Surgery | Admitting: Orthopedic Surgery

## 2016-03-28 DIAGNOSIS — M545 Low back pain: Secondary | ICD-10-CM

## 2016-03-28 DIAGNOSIS — M533 Sacrococcygeal disorders, not elsewhere classified: Secondary | ICD-10-CM

## 2016-03-28 NOTE — Discharge Instructions (Signed)
Joint Injection Discharge Instructions ° °1. After your joint injection, use ice to the affected area for the next 24 hours as a temporary increase in pain is not uncommon for a day or two after your procedure. ° °2. Resume all medications unless otherwise instructed. ° °3. Follow up with the ordering physician for post care. ° °4. If you have any of the following please call [phone]: ° °    Temperature greater than 101 °    Pain, redness or swelling at the injection site °

## 2016-06-02 ENCOUNTER — Other Ambulatory Visit: Payer: Self-pay | Admitting: Orthopedic Surgery

## 2016-06-10 ENCOUNTER — Other Ambulatory Visit (HOSPITAL_COMMUNITY): Payer: Self-pay

## 2016-06-10 NOTE — Pre-Procedure Instructions (Addendum)
Scott Merritt  06/10/2016      [id number] [address], [place] [address] Phone: [phone] Fax: [phone]    Your procedure is scheduled on April 19.  Report to [facility] Admitting at 8:45 A.M.  Call this number if you have problems the morning of surgery:  [phone]   Remember:  Do not eat food or drink liquids after midnight.  Take these medicines the morning of surgery with A SIP OF WATER :if needed  traMADol (ULTRAM),  albuterol (PROVENTIL HFA;VENTOLIN HFA)   Take all other medications as prescribed except 7 days prior to surgery STOP taking any Aspirin, Aleve, Naproxen, Ibuprofen, Motrin, Advil, Goody's, BC's, all herbal medications, fish oil, and all vitamins    Do not wear jewelry, make-up or nail polish.  Do not wear lotions, powders, or perfumes, or deoderant.  Do not shave 48 hours prior to surgery.  Men may shave face and neck.  Do not bring valuables to the hospital.  [facility] is not responsible for any belongings or valuables.  Contacts, dentures or bridgework may not be worn into surgery.  Leave your suitcase in the car.  After surgery it may be brought to your room.  For patients admitted to the hospital, discharge time will be determined by your treatment team.  Patients discharged the day of surgery will not be allowed to drive home.   Name and phone number of your driver:    Special instructions:   [place]- Preparing For Surgery  Before surgery, you can play an important role. Because skin is not sterile, your skin needs to be as free of germs as possible. You can reduce the number of germs on your skin by washing with CHG (chlorahexidine gluconate) Soap before surgery.  CHG is an antiseptic cleaner which kills germs and bonds with the skin to continue killing germs even after washing.  Please do not use if you have an allergy to CHG or  antibacterial soaps. If your skin becomes reddened/irritated stop using the CHG.  Do not shave (including legs and underarms) for at least 48 hours prior to first CHG shower. It is OK to shave your face.  Please follow these instructions carefully.   1. Shower the NIGHT BEFORE SURGERY and the MORNING OF SURGERY with CHG.   2. If you chose to wash your hair, wash your hair first as usual with your normal shampoo.  3. After you shampoo, rinse your hair and body thoroughly to remove the shampoo.  4. Use CHG as you would any other liquid soap. You can apply CHG directly to the skin and wash gently with a scrungie or a clean washcloth.   5. Apply the CHG Soap to your body ONLY FROM THE NECK DOWN.  Do not use on open wounds or open sores. Avoid contact with your eyes, ears, mouth and genitals (private parts). Wash genitals (private parts) with your normal soap.  6. Wash thoroughly, paying special attention to the area where your surgery will be performed.  7. Thoroughly rinse your body with warm water from the neck down.  8. DO NOT shower/wash with your normal soap after using and rinsing off the CHG Soap.  9. Pat yourself dry with a CLEAN TOWEL.   10. Wear CLEAN PAJAMAS   11. Place CLEAN SHEETS on your bed the night of your first shower and DO NOT SLEEP WITH PETS.  Day of Surgery: Do not apply any deodorants/lotions. Please wear clean clothes to the hospital/surgery center.      Please read over the following fact sheets that you were given. Pain Booklet and Surgical Site Infection Prevention

## 2016-06-11 ENCOUNTER — Encounter (HOSPITAL_COMMUNITY): Payer: Self-pay

## 2016-06-11 ENCOUNTER — Ambulatory Visit (HOSPITAL_COMMUNITY)
Admission: RE | Admit: 2016-06-11 | Discharge: 2016-06-11 | Disposition: A | Discharge status: Home / Self Care | Payer: Medicare Other | Source: Ambulatory Visit | Attending: Orthopedic Surgery | Admitting: Orthopedic Surgery

## 2016-06-11 ENCOUNTER — Encounter (HOSPITAL_COMMUNITY)
Admission: RE | Admit: 2016-06-11 | Discharge: 2016-06-11 | Disposition: A | Discharge status: Home / Self Care | Payer: Medicare Other | Source: Ambulatory Visit | Attending: Orthopedic Surgery | Admitting: Orthopedic Surgery

## 2016-06-11 DIAGNOSIS — Z01818 Encounter for other preprocedural examination: Secondary | ICD-10-CM | POA: Insufficient documentation

## 2016-06-11 DIAGNOSIS — M533 Sacrococcygeal disorders, not elsewhere classified: Secondary | ICD-10-CM | POA: Insufficient documentation

## 2016-06-11 DIAGNOSIS — Z01810 Encounter for preprocedural cardiovascular examination: Secondary | ICD-10-CM | POA: Diagnosis not present

## 2016-06-11 HISTORY — DX: Insomnia, unspecified: G47.00

## 2016-06-11 HISTORY — DX: Headache: R51

## 2016-06-11 HISTORY — DX: Neoplasm of unspecified behavior of unspecified site: D49.9

## 2016-06-11 HISTORY — DX: Headache, unspecified: R51.9

## 2016-06-11 HISTORY — DX: Gastro-esophageal reflux disease without esophagitis: K21.9

## 2016-06-11 HISTORY — DX: Personal history of other diseases of the digestive system: Z87.19

## 2016-06-11 LAB — URINALYSIS, ROUTINE W REFLEX MICROSCOPIC
BILIRUBIN URINE: NEGATIVE
Glucose, UA: NEGATIVE mg/dL
Hgb urine dipstick: NEGATIVE
KETONES UR: NEGATIVE mg/dL
Leukocytes, UA: NEGATIVE
NITRITE: NEGATIVE
PROTEIN: NEGATIVE mg/dL
Specific Gravity, Urine: 1.011 (ref 1.005–1.030)
pH: 6 (ref 5.0–8.0)

## 2016-06-11 LAB — CBC WITH DIFFERENTIAL/PLATELET
BASOS PCT: 1 %
Basophils Absolute: 0 10*3/uL (ref 0.0–0.1)
EOS ABS: 0.5 10*3/uL (ref 0.0–0.7)
Eosinophils Relative: 8 %
HEMATOCRIT: 46.5 % (ref 39.0–52.0)
Hemoglobin: 15.9 g/dL (ref 13.0–17.0)
Lymphocytes Relative: 31 %
Lymphs Abs: 1.9 10*3/uL (ref 0.7–4.0)
MCH: 29.9 pg (ref 26.0–34.0)
MCHC: 34.2 g/dL (ref 30.0–36.0)
MCV: 87.6 fL (ref 78.0–100.0)
MONO ABS: 0.4 10*3/uL (ref 0.1–1.0)
MONOS PCT: 6 %
NEUTROS ABS: 3.3 10*3/uL (ref 1.7–7.7)
Neutrophils Relative %: 54 %
Platelets: 189 10*3/uL (ref 150–400)
RBC: 5.31 MIL/uL (ref 4.22–5.81)
RDW: 13.2 % (ref 11.5–15.5)
WBC: 6 10*3/uL (ref 4.0–10.5)

## 2016-06-11 LAB — COMPREHENSIVE METABOLIC PANEL
ALBUMIN: 4.2 g/dL (ref 3.5–5.0)
ALT: 23 U/L (ref 17–63)
ANION GAP: 8 (ref 5–15)
AST: 21 U/L (ref 15–41)
Alkaline Phosphatase: 52 U/L (ref 38–126)
BUN: 16 mg/dL (ref 6–20)
CO2: 28 mmol/L (ref 22–32)
Calcium: 9.3 mg/dL (ref 8.9–10.3)
Chloride: 102 mmol/L (ref 101–111)
Creatinine, Ser: 0.98 mg/dL (ref 0.61–1.24)
GFR calc non Af Amer: >60 mL/min (ref >60)
GLUCOSE: 99 mg/dL (ref 65–99)
POTASSIUM: 3.9 mmol/L (ref 3.5–5.1)
Sodium: 138 mmol/L (ref 135–145)
Total Bilirubin: 0.4 mg/dL (ref 0.3–1.2)
Total Protein: 6.9 g/dL (ref 6.5–8.1)

## 2016-06-11 LAB — PROTIME-INR
INR: 1.09
PROTHROMBIN TIME: 14.1 s (ref 11.4–15.2)

## 2016-06-11 LAB — APTT: aPTT: 29 seconds (ref 24–36)

## 2016-06-11 LAB — TYPE AND SCREEN
ABO/RH(D): O POS
ANTIBODY SCREEN: NEGATIVE

## 2016-06-11 LAB — SURGICAL PCR SCREEN
MRSA, PCR: NEGATIVE
Staphylococcus aureus: NEGATIVE

## 2016-06-11 LAB — ABO/RH: ABO/RH(D): O POS

## 2016-06-11 NOTE — Progress Notes (Signed)
PCP - Cyndi Bender Cardiologist - denies   Chest x-ray - 06/11/16 EKG - 06/11/16 Stress Test - requesting ECHO - requesting Cardiac Cath - denies  Records requested will send to anesthesia for review   Patient denies shortness of breath, fever, cough and chest pain at PAT appointment   Patient verbalized understanding of instructions that was given to them at the PAT appointment. Patient expressed that there were no further questions.  Patient was also instructed that they will need to review over the PAT instructions again at home before the surgery.

## 2016-06-11 NOTE — Progress Notes (Signed)
Anesthesia chart review: Patient is a 51 year old male scheduled for right sided sacroiliac joint fusion on 06/12/2016 by Dr. Lynann Bologna.  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   PCP is Cyndi Bender, PA.  BP (!) 145/82   Pulse 74   Temp 36.4 C   Resp 20   Ht 5' 10.5" (1.791 m)   Wt 201 lb 4.8 oz (91.3 kg)   SpO2 98%   BMI 28.48 kg/m   Meds include albuterol, aspirin 325 mg (on hold), Klonopin, Flexeril, Toprol XL, Seroquel, tramadol.  EKG 06/11/16: NSR.  Echo 2/18.14 (scanned under Media tab, Correspondence 12/02/12): Interpretation: LVH. Normal LVEF 55-60%. Diastolic left ventricular dysfunction. Dilated left atrium. The thoracic aorta is mildly dilated. Normal right ventricular contractile performance. No detectable intracardiac shunt.  Zio Patch Holter Moniotr 05/17/12 [facility]; Care Everywhere): Findings: Patient had a minHR of 57 bpm, maxHR of 151 bpm,and avg HR of 85 bpm. Predominantunderlying rhythmwas Sinus Rhythm.Isolated SVEs, SVE Couplets, andSVE Triplets wererare (0 to <1.0%).Isolated VEs and VE Couplets wererare (0 to <1.0%),and no VE Tripletswere found.  Carotid U/S 04/07/12 [facility]; Care Everywhere): Result impression: No significant carotid plaque or stenosis.  Preoperative labs noted.  If no acute changes then I anticipate that he can proceed as planned.  [facility] Short Stay Center/Anesthesiology Phone [phone] 06/11/2016 1:22 PM

## 2016-06-12 ENCOUNTER — Encounter (HOSPITAL_COMMUNITY): Payer: Self-pay | Admitting: *Deleted

## 2016-06-12 ENCOUNTER — Encounter (HOSPITAL_COMMUNITY)
Admission: RE | Disposition: A | Discharge status: Home / Self Care | Payer: Self-pay | Source: Ambulatory Visit | Attending: Orthopedic Surgery

## 2016-06-12 ENCOUNTER — Ambulatory Visit (HOSPITAL_COMMUNITY): Payer: Medicare Other

## 2016-06-12 ENCOUNTER — Ambulatory Visit (HOSPITAL_COMMUNITY): Payer: Medicare Other | Admitting: Anesthesiology

## 2016-06-12 ENCOUNTER — Ambulatory Visit (HOSPITAL_COMMUNITY): Payer: Medicare Other | Admitting: Vascular Surgery

## 2016-06-12 ENCOUNTER — Ambulatory Visit (HOSPITAL_COMMUNITY)
Admission: RE | Admit: 2016-06-12 | Discharge: 2016-06-12 | Disposition: A | Discharge status: Home / Self Care | Payer: Medicare Other | Source: Ambulatory Visit | Attending: Orthopedic Surgery | Admitting: Orthopedic Surgery

## 2016-06-12 DIAGNOSIS — I69311 Memory deficit following cerebral infarction: Secondary | ICD-10-CM | POA: Diagnosis not present

## 2016-06-12 DIAGNOSIS — Z7982 Long term (current) use of aspirin: Secondary | ICD-10-CM | POA: Diagnosis not present

## 2016-06-12 DIAGNOSIS — J45909 Unspecified asthma, uncomplicated: Secondary | ICD-10-CM | POA: Insufficient documentation

## 2016-06-12 DIAGNOSIS — Z885 Allergy status to narcotic agent status: Secondary | ICD-10-CM | POA: Diagnosis not present

## 2016-06-12 DIAGNOSIS — I10 Essential (primary) hypertension: Secondary | ICD-10-CM | POA: Insufficient documentation

## 2016-06-12 DIAGNOSIS — Z79899 Other long term (current) drug therapy: Secondary | ICD-10-CM | POA: Diagnosis not present

## 2016-06-12 DIAGNOSIS — Z419 Encounter for procedure for purposes other than remedying health state, unspecified: Secondary | ICD-10-CM

## 2016-06-12 DIAGNOSIS — M533 Sacrococcygeal disorders, not elsewhere classified: Secondary | ICD-10-CM | POA: Insufficient documentation

## 2016-06-12 DIAGNOSIS — Z888 Allergy status to other drugs, medicaments and biological substances status: Secondary | ICD-10-CM | POA: Insufficient documentation

## 2016-06-12 HISTORY — PX: SACROILIAC JOINT FUSION: SHX6088

## 2016-06-12 SURGERY — SACROILIAC JOINT FUSION
Anesthesia: General | Laterality: Right

## 2016-06-12 MED ORDER — LIDOCAINE 2% (20 MG/ML) 5 ML SYRINGE
INTRAMUSCULAR | Status: AC
  Filled 2016-06-12: qty 5

## 2016-06-12 MED ORDER — 0.9 % SODIUM CHLORIDE (POUR BTL) OPTIME
TOPICAL | Status: DC | PRN
  Administered 2016-06-12: 1000 mL

## 2016-06-12 MED ORDER — FENTANYL CITRATE (PF) 250 MCG/5ML IJ SOLN
INTRAMUSCULAR | Status: AC
  Filled 2016-06-12: qty 5

## 2016-06-12 MED ORDER — METOPROLOL TARTRATE 50 MG PO TABS
50.0000 mg | ORAL_TABLET | Freq: Once | ORAL | Status: AC
  Administered 2016-06-12: 50 mg via ORAL
  Filled 2016-06-12: qty 1

## 2016-06-12 MED ORDER — EPHEDRINE 5 MG/ML INJ
INTRAVENOUS | Status: AC
  Filled 2016-06-12: qty 10

## 2016-06-12 MED ORDER — EPINEPHRINE PF 1 MG/ML IJ SOLN
INTRAMUSCULAR | Status: DC | PRN
  Administered 2016-06-12: 1 mg

## 2016-06-12 MED ORDER — BUPIVACAINE HCL (PF) 0.25 % IJ SOLN
INTRAMUSCULAR | Status: DC | PRN
  Administered 2016-06-12: 6 mL

## 2016-06-12 MED ORDER — ONDANSETRON HCL 4 MG/2ML IJ SOLN
INTRAMUSCULAR | Status: DC | PRN
  Administered 2016-06-12: 4 mg via INTRAVENOUS

## 2016-06-12 MED ORDER — EPINEPHRINE PF 1 MG/ML IJ SOLN
INTRAMUSCULAR | Status: AC
  Filled 2016-06-12: qty 1

## 2016-06-12 MED ORDER — POVIDONE-IODINE 7.5 % EX SOLN: Freq: Once | CUTANEOUS | Status: DC

## 2016-06-12 MED ORDER — PROPOFOL 10 MG/ML IV BOLUS
INTRAVENOUS | Status: DC | PRN
  Administered 2016-06-12: 150 mg via INTRAVENOUS

## 2016-06-12 MED ORDER — SUGAMMADEX SODIUM 200 MG/2ML IV SOLN
INTRAVENOUS | Status: DC | PRN
  Administered 2016-06-12: 200 mg via INTRAVENOUS

## 2016-06-12 MED ORDER — BUPIVACAINE LIPOSOME 1.3 % IJ SUSP
20.0000 mL | INTRAMUSCULAR | Status: DC
  Filled 2016-06-12: qty 20

## 2016-06-12 MED ORDER — HYDROMORPHONE HCL 1 MG/ML IJ SOLN
INTRAMUSCULAR | Status: AC
  Filled 2016-06-12: qty 0.5

## 2016-06-12 MED ORDER — BUPIVACAINE LIPOSOME 1.3 % IJ SUSP
INTRAMUSCULAR | Status: DC | PRN
  Administered 2016-06-12: 20 mL

## 2016-06-12 MED ORDER — SUGAMMADEX SODIUM 200 MG/2ML IV SOLN
INTRAVENOUS | Status: AC
  Filled 2016-06-12: qty 2

## 2016-06-12 MED ORDER — CEFAZOLIN SODIUM-DEXTROSE 2-4 GM/100ML-% IV SOLN
2.0000 g | INTRAVENOUS | Status: AC
  Administered 2016-06-12: 2 g via INTRAVENOUS
  Filled 2016-06-12: qty 100

## 2016-06-12 MED ORDER — PROPOFOL 10 MG/ML IV BOLUS
INTRAVENOUS | Status: AC
  Filled 2016-06-12: qty 20

## 2016-06-12 MED ORDER — LIDOCAINE 2% (20 MG/ML) 5 ML SYRINGE
INTRAMUSCULAR | Status: DC | PRN
  Administered 2016-06-12: 80 mg via INTRAVENOUS

## 2016-06-12 MED ORDER — HYDROMORPHONE HCL 1 MG/ML IJ SOLN
0.2500 mg | INTRAMUSCULAR | Status: DC | PRN
  Administered 2016-06-12 (×2): 0.5 mg via INTRAVENOUS

## 2016-06-12 MED ORDER — ONDANSETRON HCL 4 MG/2ML IJ SOLN
INTRAMUSCULAR | Status: AC
  Filled 2016-06-12: qty 2

## 2016-06-12 MED ORDER — LACTATED RINGERS IV SOLN
INTRAVENOUS | Status: DC
  Administered 2016-06-12 (×2): via INTRAVENOUS

## 2016-06-12 MED ORDER — PROMETHAZINE HCL 25 MG/ML IJ SOLN: 6.2500 mg | INTRAMUSCULAR | Status: DC | PRN

## 2016-06-12 MED ORDER — METOPROLOL TARTRATE 50 MG PO TABS
ORAL_TABLET | ORAL | Status: AC
Start: 2016-06-12 — End: 2016-06-12
  Administered 2016-06-12: 50 mg via ORAL
  Filled 2016-06-12: qty 1

## 2016-06-12 MED ORDER — ROCURONIUM BROMIDE 50 MG/5ML IV SOSY
PREFILLED_SYRINGE | INTRAVENOUS | Status: AC
  Filled 2016-06-12: qty 5

## 2016-06-12 MED ORDER — MIDAZOLAM HCL 2 MG/2ML IJ SOLN
INTRAMUSCULAR | Status: AC
  Filled 2016-06-12: qty 2

## 2016-06-12 MED ORDER — MIDAZOLAM HCL 2 MG/2ML IJ SOLN
INTRAMUSCULAR | Status: DC | PRN
  Administered 2016-06-12: 2 mg via INTRAVENOUS

## 2016-06-12 MED ORDER — BUPIVACAINE HCL (PF) 0.25 % IJ SOLN
INTRAMUSCULAR | Status: AC
  Filled 2016-06-12: qty 30

## 2016-06-12 MED ORDER — PHENYLEPHRINE HCL 10 MG/ML IJ SOLN
INTRAMUSCULAR | Status: DC | PRN
  Administered 2016-06-12: 15 ug/min via INTRAVENOUS

## 2016-06-12 MED ORDER — ROCURONIUM BROMIDE 10 MG/ML (PF) SYRINGE
PREFILLED_SYRINGE | INTRAVENOUS | Status: DC | PRN
  Administered 2016-06-12: 50 mg via INTRAVENOUS

## 2016-06-12 MED ORDER — HYDROMORPHONE HCL 1 MG/ML IJ SOLN
INTRAMUSCULAR | Status: AC
  Administered 2016-06-12: 0.5 mg via INTRAVENOUS
  Filled 2016-06-12: qty 0.5

## 2016-06-12 MED ORDER — FENTANYL CITRATE (PF) 100 MCG/2ML IJ SOLN
INTRAMUSCULAR | Status: DC | PRN
  Administered 2016-06-12: 50 ug via INTRAVENOUS
  Administered 2016-06-12: 100 ug via INTRAVENOUS

## 2016-06-12 MED ORDER — EPHEDRINE SULFATE-NACL 50-0.9 MG/10ML-% IV SOSY
PREFILLED_SYRINGE | INTRAVENOUS | Status: DC | PRN
  Administered 2016-06-12: 5 mg via INTRAVENOUS
  Administered 2016-06-12: 10 mg via INTRAVENOUS
  Administered 2016-06-12: 5 mg via INTRAVENOUS

## 2016-06-12 SURGICAL SUPPLY — 65 items
BENZOIN TINCTURE PRP APPL 2/3 (GAUZE/BANDAGES/DRESSINGS) ×3 IMPLANT
BLADE CLIPPER SURG (BLADE) ×3 IMPLANT
BLADE SURG 10 STRL SS (BLADE) ×3 IMPLANT
CANISTER SUCT 3000ML PPV (MISCELLANEOUS) ×3 IMPLANT
CLOSURE WOUND 1/2 X4 (GAUZE/BANDAGES/DRESSINGS) ×1
COVER SURGICAL LIGHT HANDLE (MISCELLANEOUS) ×6 IMPLANT
DRAPE C-ARM 42X72 X-RAY (DRAPES) ×3 IMPLANT
DRAPE C-ARMOR (DRAPES) ×3 IMPLANT
DRAPE INCISE IOBAN 66X45 STRL (DRAPES) ×3 IMPLANT
DRAPE POUCH INSTRU U-SHP 10X18 (DRAPES) ×3 IMPLANT
DRAPE SURG 17X23 STRL (DRAPES) ×9 IMPLANT
DURAPREP 26ML APPLICATOR (WOUND CARE) ×3 IMPLANT
ELECT CAUTERY BLADE 6.4 (BLADE) ×3 IMPLANT
ELECT REM PT RETURN 9FT ADLT (ELECTROSURGICAL) ×3
ELECTRODE REM PT RTRN 9FT ADLT (ELECTROSURGICAL) ×1 IMPLANT
FILTER STRAW FLUID ASPIR (MISCELLANEOUS) ×3 IMPLANT
GAUZE SPONGE 4X4 12PLY STRL (GAUZE/BANDAGES/DRESSINGS) ×3 IMPLANT
GAUZE SPONGE 4X4 12PLY STRL LF (GAUZE/BANDAGES/DRESSINGS) ×3 IMPLANT
GAUZE SPONGE 4X4 16PLY XRAY LF (GAUZE/BANDAGES/DRESSINGS) ×3 IMPLANT
GLOVE BIO SURGEON STRL SZ7 (GLOVE) ×3 IMPLANT
GLOVE BIO SURGEON STRL SZ8 (GLOVE) ×3 IMPLANT
GLOVE BIOGEL PI IND STRL 7.0 (GLOVE) ×1 IMPLANT
GLOVE BIOGEL PI IND STRL 8 (GLOVE) ×1 IMPLANT
GLOVE BIOGEL PI INDICATOR 7.0 (GLOVE) ×2
GLOVE BIOGEL PI INDICATOR 8 (GLOVE) ×2
GLOVE SURG SS PI 6.5 STRL IVOR (GLOVE) ×3 IMPLANT
GOWN STRL REUS W/ TWL LRG LVL3 (GOWN DISPOSABLE) ×2 IMPLANT
GOWN STRL REUS W/ TWL XL LVL3 (GOWN DISPOSABLE) ×1 IMPLANT
GOWN STRL REUS W/TWL LRG LVL3 (GOWN DISPOSABLE) ×4
GOWN STRL REUS W/TWL XL LVL3 (GOWN DISPOSABLE) ×2
IMPL IFUSE 7.0MMX45MM (Rod) ×1 IMPLANT
IMPL IFUSE 7.0X60MM (Rod) ×1 IMPLANT
IMPLANT IFUSE 7.0MMX40MM (Rod) ×2 IMPLANT
IMPLANT IFUSE 7.0MMX45MM (Rod) ×3 IMPLANT
IMPLANT IFUSE 7.0X60MM (Rod) ×3 IMPLANT
KIT BASIN OR (CUSTOM PROCEDURE TRAY) ×3 IMPLANT
KIT ROOM TURNOVER OR (KITS) ×3 IMPLANT
MANIFOLD NEPTUNE II (INSTRUMENTS) ×3 IMPLANT
NEEDLE 22X1 1/2 (OR ONLY) (NEEDLE) ×3 IMPLANT
NEEDLE HYPO 25GX1X1/2 BEV (NEEDLE) ×3 IMPLANT
NS IRRIG 1000ML POUR BTL (IV SOLUTION) ×3 IMPLANT
PACK UNIVERSAL I (CUSTOM PROCEDURE TRAY) ×3 IMPLANT
PAD ARMBOARD 7.5X6 YLW CONV (MISCELLANEOUS) ×6 IMPLANT
PENCIL BUTTON HOLSTER BLD 10FT (ELECTRODE) ×3 IMPLANT
SPONGE LAP 18X18 X RAY DECT (DISPOSABLE) ×3 IMPLANT
STAPLER VISISTAT 35W (STAPLE) ×3 IMPLANT
STRIP CLOSURE SKIN 1/2X4 (GAUZE/BANDAGES/DRESSINGS) ×2 IMPLANT
SUT MNCRL AB 4-0 PS2 18 (SUTURE) ×3 IMPLANT
SUT VIC AB 0 CT1 18XCR BRD 8 (SUTURE) IMPLANT
SUT VIC AB 0 CT1 8-18 (SUTURE)
SUT VIC AB 1 CT1 18XCR BRD 8 (SUTURE) ×1 IMPLANT
SUT VIC AB 1 CT1 8-18 (SUTURE) ×2
SUT VIC AB 2-0 CT2 18 VCP726D (SUTURE) ×3 IMPLANT
SYR BULB IRRIGATION 50ML (SYRINGE) ×6 IMPLANT
SYR CONTROL 10ML LL (SYRINGE) ×3 IMPLANT
SYR TB 1ML LUER SLIP (SYRINGE) ×3 IMPLANT
SYS SPNL FX3ANG 40X7XFSN (Rod) ×1 IMPLANT
SYSTEM SPNL FX3ANG 40X7XFSN (Rod) ×1 IMPLANT
TAPE CLOTH SURG 4X10 WHT LF (GAUZE/BANDAGES/DRESSINGS) ×3 IMPLANT
TOWEL OR 17X24 6PK STRL BLUE (TOWEL DISPOSABLE) ×3 IMPLANT
TOWEL OR 17X26 10 PK STRL BLUE (TOWEL DISPOSABLE) ×6 IMPLANT
TUBE CONNECTING 12'X1/4 (SUCTIONS) ×1
TUBE CONNECTING 12X1/4 (SUCTIONS) ×2 IMPLANT
WATER STERILE IRR 1000ML POUR (IV SOLUTION) ×3 IMPLANT
YANKAUER SUCT BULB TIP NO VENT (SUCTIONS) ×3 IMPLANT

## 2016-06-12 NOTE — H&P (Signed)
PREOPERATIVE H&P  Chief Complaint: R low back pain  HPI: Scott Merritt is a 51 y.o. male who presents with ongoing pain in the low back  History and exam very consistent with right SI pain  Patient has failed multiple forms of conservative care and continues to have pain (see office notes for additional details regarding the patient's full course of treatment)  Past Medical History:  Diagnosis Date  . 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 Arthritis    left shoulder, hands  . Asthma    rare use of neb., none in > 1 yr.  Marland Kitchen GERD (gastroesophageal reflux disease)   . Headache   . History of bronchitis   . History of hiatal hernia   . Hypertension    under control with meds., has been on med. x 2 yr.  . Insomnia   . Rotator cuff tear 11/2012   left  . Stroke ([place]) 03/2012   residual balance problems and memory problems  . 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 bicep rupture    . CHOLECYSTECTOMY    . COLONOSCOPY    . HERNIA REPAIR  2008  . LUMBAR LAMINECTOMY/DECOMPRESSION MICRODISCECTOMY Right 03/14/2009   L5-S1  . SHOULDER ARTHROSCOPY Right 11/23/2003  . SHOULDER ARTHROSCOPY WITH ROTATOR CUFF REPAIR AND SUBACROMIAL DECOMPRESSION Left 12/02/2012   Procedure: LEFT SHOULDER ARTHROSCOPY WITH SUBACROMIAL DECOMPRESSION, OPEN BICEPS TENODESIS, REPAIR OF PITA TEAR, DEBRIDEMENT OF SLAP TEAR;  Surgeon: Cammie Sickle., MD;  Location: [facility];  Service: Orthopedics;  Laterality: Left;  . TUMOR EXCISION  2007   pancreas   Social History   Social History  . Marital status: Married    Spouse name: N/A  . Number of children: N/A  . Years of education: N/A   Social History Main Topics  . Smoking status: Never Smoker  . Smokeless tobacco: Current User    Types: Chew  . Alcohol use No  . Drug use: No  . Sexual activity: Not on file   Other Topics Concern  . Not on file    Social History Narrative  . No narrative on file   No family history on file. Allergies  Allergen Reactions  . Prednisone Other (See Comments)    BLURRED VISION  . Sudafed [Pseudoephedrine Hcl] Hives  . Lisinopril Cough    cough  . Losartan Other (See Comments)    lethargic  . Hydrocodone Nausea Only   Prior to Admission medications   Medication Sig Start Date End Date Taking? Authorizing Provider  acetaminophen (TYLENOL 8 HOUR ARTHRITIS PAIN) 650 MG CR tablet Take 1,300 mg by mouth 2 (two) times daily as needed for pain.   Yes Historical Provider, MD  albuterol (PROVENTIL HFA;VENTOLIN HFA) 108 (90 Base) MCG/ACT inhaler Inhale 1-2 puffs into the lungs every 6 (six) hours as needed for wheezing or shortness of breath.   Yes Historical Provider, MD  albuterol (PROVENTIL) (2.5 MG/3ML) 0.083% nebulizer solution Take 2.5 mg by nebulization every 6 (six) hours as needed for wheezing or shortness of breath.   Yes Historical Provider, MD  clonazePAM (KLONOPIN) 0.5 MG tablet Take 1 mg by mouth at bedtime.    Yes Historical Provider, MD  cyclobenzaprine (FLEXERIL) 10 MG tablet Take 10 mg by mouth at bedtime.    Yes Historical Provider, MD  metoprolol succinate (TOPROL-XL) 100 MG 24 hr tablet Take  50 mg by mouth at bedtime. Take with or immediately following a meal.    Yes Historical Provider, MD  QUEtiapine (SEROQUEL) 25 MG tablet Take 50 mg by mouth at bedtime. 06/02/16  Yes Historical Provider, MD  traMADol (ULTRAM) 50 MG tablet Take 50 mg by mouth 3 (three) times daily as needed for pain. 06/02/16  Yes Historical Provider, MD  aspirin 325 MG tablet Take 325 mg by mouth daily.    Historical Provider, MD     All other systems have been reviewed and were otherwise negative with the exception of those mentioned in the HPI and as above.  Physical Exam: There were no vitals filed for this visit.  General: Alert, no acute distress Cardiovascular: No pedal edema Respiratory: No cyanosis, no use  of accessory musculature Skin: No lesions in the area of chief complaint Neurologic: Sensation intact distally Psychiatric: Patient is competent for consent with normal mood and affect Lymphatic: No axillary or cervical lymphadenopathy  MUSCULOSKELETAL: + TTP R SI joint  Assessment/Plan: Right sacroiliac joint dysfunction Plan for Procedure(s): RIGTH SIDED SACROILIAC JOINT FUSION   Sinclair Ship, MD 06/12/2016 6:54 AM

## 2016-06-12 NOTE — Transfer of Care (Signed)
Immediate Anesthesia Transfer of Care Note  Patient: Scott Merritt  Procedure(s) Performed: Procedure(s) with comments: RIGHT SIDED SACROILIAC JOINT FUSION (Right) - RIGTH SIDED SACROILIAC JOINT FUSION  Patient Location: PACU  Anesthesia Type:General  Level of Consciousness: awake, alert , oriented and patient cooperative  Airway & Oxygen Therapy: Patient Spontanous Breathing on RA  Post-op Assessment: Report given to RN, Post -op Vital signs reviewed and stable and Patient moving all extremities X 4  Post vital signs: Reviewed and stable  Last Vitals:  Vitals:   06/12/16 0810 06/12/16 1327  BP: 138/82   Pulse: 79   Resp: 20   Temp:  36.6 C    Last Pain:  Vitals:   06/12/16 0818  TempSrc:   PainSc: 4       Patients Stated Pain Goal: 3 (86/76/72 0947)  Complications: No apparent anesthesia complications

## 2016-06-12 NOTE — Anesthesia Procedure Notes (Signed)
Procedure Name: Intubation Date/Time: 06/12/2016 11:40 AM Performed by: Mervyn Gay Pre-anesthesia Checklist: Patient identified, Patient being monitored, Timeout performed, Emergency Drugs available and Suction available Patient Re-evaluated:Patient Re-evaluated prior to inductionOxygen Delivery Method: Circle System Utilized Preoxygenation: Pre-oxygenation with 100% oxygen Intubation Type: IV induction Ventilation: Mask ventilation without difficulty Laryngoscope Size: Miller and 3 Grade View: Grade I Tube type: Oral Tube size: 8.0 mm Number of attempts: 1 Airway Equipment and Method: Stylet Placement Confirmation: ETT inserted through vocal cords under direct vision,  positive ETCO2 and breath sounds checked- equal and bilateral Secured at: 23 cm Tube secured with: Tape Dental Injury: Teeth and Oropharynx as per pre-operative assessment

## 2016-06-12 NOTE — Anesthesia Preprocedure Evaluation (Signed)
Anesthesia Evaluation  Patient identified by MRN, date of birth, ID band Patient awake    Reviewed: Allergy & Precautions, NPO status , Patient's Chart, lab work & pertinent test results  Airway Mallampati: II  TM Distance: >3 FB Neck ROM: Full    Dental  (+) Dental Advisory Given   Pulmonary asthma ,    breath sounds clear to auscultation       Cardiovascular hypertension, Pt. on medications and Pt. on home beta blockers  Rhythm:Regular Rate:Normal     Neuro/Psych CVA    GI/Hepatic Neg liver ROS, hiatal hernia, GERD  ,  Endo/Other  negative endocrine ROS  Renal/GU negative Renal ROS     Musculoskeletal  (+) Arthritis ,   Abdominal   Peds  Hematology negative hematology ROS (+)   Anesthesia Other Findings   Reproductive/Obstetrics                             Lab Results  Component Value Date   WBC 6.0 06/11/2016   HGB 15.9 06/11/2016   HCT 46.5 06/11/2016   MCV 87.6 06/11/2016   PLT 189 06/11/2016   Lab Results  Component Value Date   CREATININE 0.98 06/11/2016   BUN 16 06/11/2016   NA 138 06/11/2016   K 3.9 06/11/2016   CL 102 06/11/2016   CO2 28 06/11/2016    Anesthesia Physical Anesthesia Plan  ASA: II  Anesthesia Plan: General   Post-op Pain Management:    Induction: Intravenous  Airway Management Planned: Oral ETT  Additional Equipment:   Intra-op Plan:   Post-operative Plan: Extubation in OR  Informed Consent: I have reviewed the patients History and Physical, chart, labs and discussed the procedure including the risks, benefits and alternatives for the proposed anesthesia with the patient or authorized representative who has indicated his/her understanding and acceptance.   Dental advisory given  Plan Discussed with:   Anesthesia Plan Comments:         Anesthesia Quick Evaluation

## 2016-06-12 NOTE — Op Note (Signed)
Scott Merritt, Scott Merritt              ACCOUNT NO.:  [id number]  MEDICAL RECORD NO.:  [id number]  LOCATION:  PERIO                        FACILITY:  [facility]  PHYSICIAN:  Phylliss Bob, MD      DATE OF BIRTH:  [birth year]  DATE OF PROCEDURE:  06/12/2016                              OPERATIVE REPORT   PREOPERATIVE DIAGNOSIS:  Right sacroiliac joint dysfunction.  POSTOPERATIVE DIAGNOSIS:  Right sacroiliac joint dysfunction.  PROCEDURE:  Right-sided sacroiliac joint fusion using the iFuse fusion system.  SURGEON:  Phylliss Bob, MD  ASSISTANT:  Pricilla Holm, PA-C.  ANESTHESIA:  General endotracheal anesthesia.  COMPLICATIONS:  None.  DISPOSITION:  Stable.  ESTIMATED BLOOD LOSS:  Minimal.  INDICATIONS FOR SURGERY:  Briefly, Scott Merritt is a pleasant 51 year old male who did present to me with ongoing rather debilitating pain at the right side of his low back.  His history and exam were very consistent with right sacroiliac joint dysfunction.  Given the patient's ongoing pain and dysfunction, we did discuss proceeding with the procedure reflected above.  The patient was fully aware of the risks and limitations of surgery, including the possibility of nonunion, lack of resolution of pain, neurologic injury, as well as vascular injury and infection.  OPERATIVE DETAILS:  On June 12, 2016, the patient was brought to surgery and general endotracheal anesthesia was administered.  The patient was placed prone on a well-padded flat Jackson bed.  Gel rolls were placed under the patient's chest and hips.  Antibiotics were given. The back was prepped and draped and a time-out procedure was performed. A 3-cm incision was then made in line with the posterior border of the sacrum.  Three guidewires were then advanced across the sacroiliac joint, liberally using lateral, inlet, and outlet radiographs.  The first guidewire was advanced above the S1 foramen, the second was in line with the S1  foramen, and the third was between the S1 and the S2 foramina.  Of note, the patient did have a rather steep sacral ala and it was rather difficult to precisely obtain the most appropriate lateral radiograph, however, after obtaining multiple different fluoroscopic images and angles, I did feel that all 3 pins were in their appropriate satisfactory position.  I then drilled and broached over the guidewires. I then placed implants of the appropriate length.  From superior to inferior, these were 60 mm, 40 mm, and 45 mm in length.  I was very pleased with the press-fit of each of the implants.  The wound was then copiously irrigated.  The wound was then closed in layers using #1 Vicryl followed by 2-0 Vicryl, followed by 4-0 Monocryl.  Benzoin and Steri-Strips were applied followed by sterile dressing.  All instrument and counts were correct at the termination of the procedure.  Of note, Pricilla Holm was my assistant throughout surgery, and did aid in retraction, suctioning, and closure from start to finish.     Phylliss Bob, MD     MD/MEDQ  D:  06/12/2016  T:  06/12/2016  Job:  [id number]

## 2016-06-13 ENCOUNTER — Encounter (HOSPITAL_COMMUNITY): Payer: Self-pay | Admitting: Orthopedic Surgery

## 2016-06-13 NOTE — Anesthesia Postprocedure Evaluation (Signed)
Anesthesia Post Note  Patient: Gaetano Net  Procedure(s) Performed: Procedure(s) (LRB): RIGHT SIDED SACROILIAC JOINT FUSION (Right)  Patient location during evaluation: PACU Anesthesia Type: General Level of consciousness: awake and alert Pain management: pain level controlled Vital Signs Assessment: post-procedure vital signs reviewed and stable Respiratory status: spontaneous breathing, nonlabored ventilation, respiratory function stable and patient connected to nasal cannula oxygen Cardiovascular status: blood pressure returned to baseline and stable Postop Assessment: no signs of nausea or vomiting Anesthetic complications: no       Last Vitals:  Vitals:   06/12/16 1430 06/12/16 1432  BP: 106/70   Pulse: 62 (!) 55  Resp: 20 15  Temp: 36.2 C     Last Pain:  Vitals:   06/12/16 1430  TempSrc:   PainSc: 3                  Tiajuana Amass

## 2017-11-05 ENCOUNTER — Other Ambulatory Visit: Payer: Self-pay | Admitting: Orthopedic Surgery

## 2017-11-05 DIAGNOSIS — M533 Sacrococcygeal disorders, not elsewhere classified: Secondary | ICD-10-CM

## 2017-11-06 ENCOUNTER — Other Ambulatory Visit: Payer: Self-pay | Admitting: Orthopedic Surgery

## 2017-11-06 DIAGNOSIS — M545 Low back pain: Secondary | ICD-10-CM

## 2017-11-12 ENCOUNTER — Ambulatory Visit
Admission: RE | Admit: 2017-11-12 | Discharge: 2017-11-12 | Disposition: A | Discharge status: Home / Self Care | Payer: Medicare Other | Source: Ambulatory Visit | Attending: Orthopedic Surgery | Admitting: Orthopedic Surgery

## 2017-11-12 DIAGNOSIS — M545 Low back pain: Secondary | ICD-10-CM

## 2017-11-19 ENCOUNTER — Ambulatory Visit
Admission: RE | Admit: 2017-11-19 | Discharge: 2017-11-19 | Disposition: A | Discharge status: Home / Self Care | Payer: Medicare Other | Source: Ambulatory Visit | Attending: Orthopedic Surgery | Admitting: Orthopedic Surgery

## 2017-11-19 DIAGNOSIS — M533 Sacrococcygeal disorders, not elsewhere classified: Secondary | ICD-10-CM

## 2018-02-24 HISTORY — PX: SACROILIAC JOINT FUSION: SHX6088

## 2018-02-27 ENCOUNTER — Encounter: Payer: Self-pay | Admitting: Allergy and Immunology

## 2018-03-01 NOTE — Telephone Encounter (Signed)
Sent email to patient and cancelled appt

## 2018-03-03 ENCOUNTER — Ambulatory Visit: Payer: Self-pay | Admitting: Allergy and Immunology

## 2018-03-10 ENCOUNTER — Encounter: Payer: Self-pay | Admitting: Allergy and Immunology

## 2018-03-10 ENCOUNTER — Ambulatory Visit: Payer: Medicare Other | Admitting: Allergy and Immunology

## 2018-03-10 VITALS — BP 150/98 | HR 68 | Temp 99.0°F | Resp 16 | Ht 69.4 in | Wt 230.0 lb

## 2018-03-10 DIAGNOSIS — J4520 Mild intermittent asthma, uncomplicated: Secondary | ICD-10-CM | POA: Diagnosis not present

## 2018-03-10 DIAGNOSIS — K219 Gastro-esophageal reflux disease without esophagitis: Secondary | ICD-10-CM

## 2018-03-10 MED ORDER — PANTOPRAZOLE SODIUM 40 MG PO TBEC: 40.0000 mg | DELAYED_RELEASE_TABLET | ORAL | 5 refills | Status: DC

## 2018-03-10 MED ORDER — BUDESONIDE-FORMOTEROL FUMARATE 160-4.5 MCG/ACT IN AERO: INHALATION_SPRAY | RESPIRATORY_TRACT | 5 refills | Status: DC

## 2018-03-10 NOTE — Progress Notes (Signed)
Dear Scott Merritt,  Thank you for referring Scott Merritt to the [facility] on 03/10/2018.   Below is a summation of this patient's evaluation and recommendations.  Thank you for your referral. I will keep you informed about this patient's response to treatment.   If you have any questions please do not hesitate to contact me.   Sincerely,  Jiles Prows, MD Allergy / Immunology [facility]   ______________________________________________________________________    NEW PATIENT NOTE  Referring Provider: Cyndi Bender, PA-C Primary Provider: Cyndi Bender, PA-C Date of office visit: 03/10/2018    Subjective:   Chief Complaint:  Scott Merritt (DOB: 02/06/1966) is a 53 y.o. male who presents to the clinic on 03/10/2018 with a chief complaint of Asthma .     HPI: Scott Merritt returns to this clinic in evaluation of issues with cough.  He has a long history of recurrent bouts of "bronchitis" which is manifested as coughing fits associated with gagging and retching and laryngitis and a tickle in his chest.  He has coughed so hard that he has cracked his ribs in the past.  He has one of these events or 2 of these events per year with his last 2 events occurring spring 2019 and October through December 2019.  Triggers for these issues usually include a head cold but not on all occasions.  What is interesting is that with his last bout from October through December 2019 he did develop reflux problems during that timeframe with burning and some occasional regurgitation.  Ever since that event he is much better but he still has throat clearing and feeling as though there is phlegm coated the back of his throat and he has never had return of his voice to normal.  Usually he is treated with systemic steroids and occasionally an antibiotic and he has been given Advair and a short acting bronchodilator  which he uses only when he develops 1 of these flareups.  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  About 1 year ago he had return of some heartburn for which he took Protonix for a period in time and this appeared to have resolved only to show itself again during his episodes of bronchitis.  He drinks 2 cups of coffee in the morning and eats chocolate multiple times a week.  He does not have a tremendous amount of upper airway issues.  Past Medical History:  Diagnosis Date  . 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 Arthritis    left shoulder, hands  . Asthma    rare use of neb., none in > 1 yr.  Marland Kitchen GERD (gastroesophageal reflux disease)   . Headache   . History of bronchitis   . History of hiatal hernia   . Hypertension    under control with meds., has been on med. x 2 yr.  . Insomnia   . Rotator cuff tear 11/2012   left  . Stroke ([place]) 03/2012   residual balance problems and memory problems  . 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 bicep rupture    . CHOLECYSTECTOMY    . COLONOSCOPY    . HERNIA REPAIR  2008  . LUMBAR LAMINECTOMY/DECOMPRESSION MICRODISCECTOMY Right 03/14/2009   L5-S1  . SACROILIAC JOINT  FUSION Right 06/12/2016   Procedure: RIGHT SIDED SACROILIAC JOINT FUSION;  Surgeon: Phylliss Bob, MD;  Location: [facility];  Service: Orthopedics;  Laterality: Right;  RIGTH SIDED SACROILIAC JOINT FUSION  . SHOULDER ARTHROSCOPY Right 11/23/2003  . SHOULDER ARTHROSCOPY WITH ROTATOR CUFF REPAIR AND SUBACROMIAL DECOMPRESSION Left 12/02/2012   Procedure: LEFT SHOULDER ARTHROSCOPY WITH SUBACROMIAL DECOMPRESSION, OPEN BICEPS TENODESIS, REPAIR OF PITA TEAR, DEBRIDEMENT OF SLAP TEAR;  Surgeon: Cammie Sickle., MD;  Location: [facility];  Service: Orthopedics;  Laterality: Left;  . TUMOR EXCISION  2007   pancreas    Allergies as of 03/10/2018       Reactions   Prednisone Other (See Comments)   BLURRED VISION   Sudafed [pseudoephedrine Hcl] Hives   Lisinopril Cough   cough   Losartan Other (See Comments)   lethargic   Hydrocodone Nausea Only      Medication List      albuterol 108 (90 Base) MCG/ACT inhaler Commonly known as:  PROVENTIL HFA;VENTOLIN HFA Inhale 1-2 puffs into the lungs every 6 (six) hours as needed for wheezing or shortness of breath.   albuterol (2.5 MG/3ML) 0.083% nebulizer solution Commonly known as:  PROVENTIL Take 2.5 mg by nebulization every 6 (six) hours as needed for wheezing or shortness of breath.   clonazePAM 0.5 MG tablet Commonly known as:  KLONOPIN Take 1 mg by mouth at bedtime.   metoprolol succinate 100 MG 24 hr tablet Commonly known as:  TOPROL-XL Take 50 mg by mouth at bedtime. Take with or immediately following a meal.   QUEtiapine 25 MG tablet Commonly known as:  SEROQUEL Take 50 mg by mouth at bedtime.       Review of systems negative except as noted in HPI / PMHx or noted below:  Review of Systems  Constitutional: Negative.   HENT: Negative.   Eyes: Negative.   Respiratory: Negative.   Cardiovascular: Negative.   Gastrointestinal: Negative.   Genitourinary: Negative.   Musculoskeletal: Negative.   Skin: Negative.   Neurological: Negative.   Endo/Heme/Allergies: Negative.   Psychiatric/Behavioral: Negative.     History reviewed. No pertinent family history.  Social History   Socioeconomic History  . Marital status: Married    Spouse name: Not on file  . Number of children: Not on file  . Years of education: Not on file  . Highest education level: Not on file  Occupational History  . Not on file  Social Needs  . Financial resource strain: Not on file  . Food insecurity:    Worry: Not on file    Inability: Not on file  . Transportation needs:    Medical: Not on file    Non-medical: Not on file  Tobacco Use  . Smoking status: Former Smoker     Packs/day: 1.50    Years: 10.00    Pack years: 15.00    Types: Cigarettes    Last attempt to quit: 2004    Years since quitting: 16.0  . Smokeless tobacco: Current User    Types: Chew  Substance and Sexual Activity  . Alcohol use: No  . Drug use: No  . Sexual activity: Not on file  Lifestyle  . Physical activity:    Days per week: Not on file    Minutes per session: Not on file  . Stress: Not on file  Relationships  . Social connections:    Talks on phone: Not on file    Gets together: Not on file  Attends religious service: Not on file    Active member of club or organization: Not on file    Attends meetings of clubs or organizations: Not on file    Relationship status: Not on file  . Intimate partner violence:    Fear of current or ex partner: Not on file    Emotionally abused: Not on file    Physically abused: Not on file    Forced sexual activity: Not on file  Other Topics Concern  . Not on file  Social History Narrative  . Not on file    Environmental and Social history  Lives in a house with a dry environment, dogs located inside the household, carpet in the bedroom, plastic on the bed, plastic on the pillow, no smokers located inside the household.  Objective:   Vitals:   03/10/18 1439  BP: (!) 150/98  Pulse: 68  Resp: 16  Temp: 99 F (37.2 C)   Height: 5' 9.4" (176.3 cm) Weight: 230 lb (104.3 kg)  Physical Exam Constitutional:      Appearance: He is not diaphoretic.     Comments: Throat clearing  HENT:     Head: Normocephalic. No right periorbital erythema or left periorbital erythema.     Right Ear: External ear normal.     Left Ear: External ear normal.     Ears:     Comments: Bilateral hearing aid    Nose: Nose normal. No mucosal edema or rhinorrhea.     Mouth/Throat:     Pharynx: Uvula midline. No oropharyngeal exudate.  Eyes:     General: Lids are normal.     Conjunctiva/sclera: Conjunctivae normal.     Pupils: Pupils are equal,  round, and reactive to light.  Neck:     Thyroid: No thyromegaly.     Trachea: Trachea normal. No tracheal tenderness or tracheal deviation.  Cardiovascular:     Rate and Rhythm: Normal rate and regular rhythm.     Heart sounds: Normal heart sounds, S1 normal and S2 normal. No murmur.  Pulmonary:     Effort: Pulmonary effort is normal. No respiratory distress.     Breath sounds: Normal breath sounds. No stridor. No wheezing or rales.  Chest:     Chest wall: No tenderness.  Abdominal:     General: There is no distension.     Palpations: Abdomen is soft. There is no mass.     Tenderness: There is no abdominal tenderness. There is no guarding or rebound.  Musculoskeletal:        General: No tenderness.  Lymphadenopathy:     Head:     Right side of head: No tonsillar adenopathy.     Left side of head: No tonsillar adenopathy.     Cervical: No cervical adenopathy.  Skin:    Coloration: Skin is not pale.     Findings: No erythema or rash.     Nails: There is no clubbing.   Neurological:     Mental Status: He is alert.     Diagnostics: Allergy skin tests were performed.  He did not demonstrate any hypersensitivity against a screening panel of aeroallergens.  Spirometry was performed and demonstrated an FEV1 of 3.32 @ 90 % of predicted. FEV1/FVC = 0.77  Results of a chest x-ray obtained 11 June 2016 identified the following:  Mediastinum and hilar structures normal. Lungs are clear. Faint nipple shadows noted . Heart size normal. Findings consist ankylosing spondylitis thoracic spine. Surgical clips right upper Quadrant.  Results of blood tests obtained 11 June 2016 identified WBC 6.0, absolute eosinophil 500, absolute lymphocyte 1900, hemoglobin 15.9, platelet 189.   Assessment and Plan:    1. Asthma, mild intermittent, well-controlled   2. LPRD (laryngopharyngeal reflux disease)     1.  Allergen avoidance measures  2.  Treat LPR with the following:   A.   Consolidate caffeine and chocolate consumption  B.  Protonix 40 mg tablet in a.m.  C.  OTC ranitidine 150 - 2 tablets in p.m.  3.  "Action plan" for respiratory flareup:   A.  Initiate treatment for LPR  B.  Symbicort 160-2 inhalations twice a day with spacer  C.  Proventil HFA or similar to inhalations every 4-6 hours  D.  OTC Mucinex DM 2 tablets twice a day  4.  Return to clinic in 6 weeks or earlier if problem  Rasheen most likely has a collection of insults to his respiratory tract that gives rise to his recurrent "bronchitis".  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  I have given him a "action plan" to initiate at the onset of his next respiratory event which will include treatment for inflammation and LPR.  Since his last bout of "bronchitis" in December 2019 he has continued to have problems with his throat and I would like for him to remain on therapy directed against LPR for the next 6 weeks I will regroup with him at that point in time to assess his response and consider further evaluation and treatment based upon this response.  Jiles Prows, MD Allergy / Immunology [facility]

## 2018-03-10 NOTE — Patient Instructions (Addendum)
  1.  Allergen avoidance measures  2.  Treat LPR with the following:   A.  Consolidate caffeine and chocolate consumption  B.  Protonix 40 mg tablet in a.m.  C.  OTC ranitidine 150 - 2 tablets in p.m.  3.  "Action plan" for respiratory flareup:   A.  Initiate treatment for LPR  B.  Symbicort 160-2 inhalations twice a day with spacer  C.  Proventil HFA or similar to inhalations every 4-6 hours  D.  OTC Mucinex DM 2 tablets twice a day  4.  Return to clinic in 6 weeks or earlier if problem

## 2018-03-11 ENCOUNTER — Encounter: Payer: Self-pay | Admitting: Allergy and Immunology

## 2018-04-19 ENCOUNTER — Ambulatory Visit: Payer: Medicare Other | Admitting: Allergy and Immunology

## 2018-04-22 ENCOUNTER — Encounter: Payer: Self-pay | Admitting: Allergy and Immunology

## 2018-04-22 ENCOUNTER — Ambulatory Visit: Payer: Medicare Other | Admitting: Allergy and Immunology

## 2018-04-22 VITALS — BP 132/90 | HR 88 | Resp 22

## 2018-04-22 DIAGNOSIS — K219 Gastro-esophageal reflux disease without esophagitis: Secondary | ICD-10-CM

## 2018-04-22 DIAGNOSIS — J4520 Mild intermittent asthma, uncomplicated: Secondary | ICD-10-CM | POA: Diagnosis not present

## 2018-04-22 NOTE — Progress Notes (Signed)
Follow-up Note  Referring Provider: Cyndi Bender, PA-C Primary Provider: Cyndi Bender, PA-C Date of Office Visit: 04/22/2018  Subjective:   Scott Merritt (DOB: October 11, 1965) is a 53 y.o. male who returns to the [facility] on 04/22/2018 in re-evaluation of the following:  HPI: Scott Merritt returns to this clinic in reevaluation of his LPR and intermittent asthma.  I last saw him in this clinic during his initial evaluation of 10 March 2018.  Utilizing therapy directed against reflux which includes consolidating his coffee consumption to 1 time per day and using a proton pump inhibitor and H2 receptor blocker he is doing wonderful with his throat.  He has no cough and he has no throat clearing and there is no phlegm coating his throat and his voice has returned to normal.  He has had no issues with shortness of breath or need to use a short acting bronchodilator or activate his action plan.  Allergies as of 04/22/2018      Reactions   Prednisone Other (See Comments)   BLURRED VISION   Sudafed [pseudoephedrine Hcl] Hives   Lisinopril Cough   cough   Losartan Other (See Comments)   lethargic   Hydrocodone Nausea Only      Medication List      albuterol 108 (90 Base) MCG/ACT inhaler Commonly known as:  PROVENTIL HFA;VENTOLIN HFA Inhale 1-2 puffs into the lungs every 6 (six) hours as needed for wheezing or shortness of breath.   albuterol (2.5 MG/3ML) 0.083% nebulizer solution Commonly known as:  PROVENTIL Take 2.5 mg by nebulization every 6 (six) hours as needed for wheezing or shortness of breath.   aspirin 325 MG tablet Take by mouth.   budesonide-formoterol 160-4.5 MCG/ACT inhaler Commonly known as:  SYMBICORT Inhale two puffs twice daily as directed during respiratory flare-up.  Rinse, gargle, and spit after use.   clonazePAM 0.5 MG tablet Commonly known as:  KLONOPIN Take 1 mg by mouth at bedtime.   cyclobenzaprine 10 MG tablet Commonly known as:   FLEXERIL Take by mouth.   diazepam 5 MG tablet Commonly known as:  VALIUM TAKE 1 TABLET BY MOUTH EVERY SIX TO EIGHT HOURS AS NEEDED FOR SPASMS   fluticasone 50 MCG/ACT nasal spray Commonly known as:  FLONASE SPRAY 1 SPRAY INTO EACH NOSTRIL EVERY DAY   pantoprazole 40 MG tablet Commonly known as:  PROTONIX Take 1 tablet (40 mg total) by mouth every morning.   QUEtiapine 50 MG tablet Commonly known as:  SEROQUEL TAKE 1 TABLET BY MOUTH EVERYDAY AT BEDTIME       Past Medical History:  Diagnosis Date  . 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 Arthritis    left shoulder, hands  . Asthma    rare use of neb., none in > 1 yr.  Marland Kitchen GERD (gastroesophageal reflux disease)   . Headache   . History of bronchitis   . History of hiatal hernia   . Hypertension    under control with meds., has been on med. x 2 yr.  . Insomnia   . Rotator cuff tear 11/2012   left  . Stroke ([place]) 03/2012   residual balance problems and memory problems  . 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 bicep rupture    . CHOLECYSTECTOMY    . COLONOSCOPY    . HERNIA REPAIR  2008  . LUMBAR LAMINECTOMY/DECOMPRESSION MICRODISCECTOMY  Right 03/14/2009   L5-S1  . SACROILIAC JOINT FUSION Right 06/12/2016   Procedure: RIGHT SIDED SACROILIAC JOINT FUSION;  Surgeon: Phylliss Bob, MD;  Location: [facility];  Service: Orthopedics;  Laterality: Right;  RIGTH SIDED SACROILIAC JOINT FUSION  . [place]  . SHOULDER ARTHROSCOPY Right 11/23/2003  . SHOULDER ARTHROSCOPY WITH ROTATOR CUFF REPAIR AND SUBACROMIAL DECOMPRESSION Left 12/02/2012   Procedure: LEFT SHOULDER ARTHROSCOPY WITH SUBACROMIAL DECOMPRESSION, OPEN BICEPS TENODESIS, REPAIR OF PITA TEAR, DEBRIDEMENT OF SLAP TEAR;  Surgeon: Cammie Sickle., MD;  Location: [facility];  Service: Orthopedics;  Laterality: Left;  . TUMOR EXCISION  2007   pancreas    Review of  systems negative except as noted in HPI / PMHx or noted below:  Review of Systems  Constitutional: Negative.   HENT: Negative.   Eyes: Negative.   Respiratory: Negative.   Cardiovascular: Negative.   Gastrointestinal: Negative.   Genitourinary: Negative.   Musculoskeletal: Negative.   Skin: Negative.   Neurological: Negative.   Endo/Heme/Allergies: Negative.   Psychiatric/Behavioral: Negative.      Objective:   Vitals:   04/22/18 1346  BP: 132/90  Pulse: 88  Resp: (!) 22          Physical Exam Constitutional:      Appearance: He is not diaphoretic.  HENT:     Head: Normocephalic.     Right Ear: External ear normal.     Left Ear: External ear normal.     Nose: Nose normal. No mucosal edema or rhinorrhea.     Mouth/Throat:     Pharynx: Uvula midline. No oropharyngeal exudate.  Eyes:     Conjunctiva/sclera: Conjunctivae normal.  Neck:     Thyroid: No thyromegaly.     Trachea: Trachea normal. No tracheal tenderness or tracheal deviation.  Cardiovascular:     Rate and Rhythm: Normal rate and regular rhythm.     Heart sounds: Normal heart sounds, S1 normal and S2 normal. No murmur.  Pulmonary:     Effort: No respiratory distress.     Breath sounds: Normal breath sounds. No stridor. No wheezing or rales.  Lymphadenopathy:     Head:     Right side of head: No tonsillar adenopathy.     Left side of head: No tonsillar adenopathy.     Cervical: No cervical adenopathy.  Skin:    Findings: No erythema or rash.     Nails: There is no clubbing.   Neurological:     Mental Status: He is alert.     Diagnostics:    Spirometry was performed and demonstrated an FEV1 of 3.10 at 84 % of predicted.  The patient had an Asthma Control Test with the following results: ACT Total Score: 21.    Assessment and Plan:   1. LPRD (laryngopharyngeal reflux disease)   2. Asthma, mild intermittent, well-controlled      1.  Continue to Treat LPR with the following:   A.   Consolidate caffeine and chocolate consumption  B.  Protonix 40 mg tablet in a.m.  C.  OTC ranitidine 150 - 2 tablets in p.m.  2.  "Action plan" for respiratory flareup:   A.  Initiate treatment for LPR  B.  Symbicort 160-2 inhalations twice a day with spacer  C.  Proventil HFA or similar to inhalations every 4-6 hours  D.  OTC Mucinex DM 2 tablets twice a day  3.  Return to clinic in 12 weeks or earlier if  problem  I will see Foday back in this clinic in 12 weeks.  I suspect that with his current plan he will do very well and hopefully he will not develop 1 of his unrelenting coughing episodes that appear to occur multiple times per year.  He will contact me during the interval should there be a significant problem.  Allena Katz, MD Allergy / Immunology [facility]

## 2018-04-22 NOTE — Patient Instructions (Addendum)
  1.  Continue to Treat LPR with the following:   A.  Consolidate caffeine and chocolate consumption  B.  Protonix 40 mg tablet in a.m.  C.  OTC ranitidine 150 - 2 tablets in p.m.  2.  "Action plan" for respiratory flareup:   A.  Initiate treatment for LPR  B.  Symbicort 160-2 inhalations twice a day with spacer  C.  Proventil HFA or similar to inhalations every 4-6 hours  D.  OTC Mucinex DM 2 tablets twice a day  3.  Return to clinic in 12 weeks or earlier if problem

## 2018-04-26 ENCOUNTER — Encounter: Payer: Self-pay | Admitting: Allergy and Immunology

## 2018-08-02 ENCOUNTER — Ambulatory Visit: Payer: Medicare Other | Admitting: Allergy and Immunology

## 2018-08-11 ENCOUNTER — Encounter: Payer: Self-pay | Admitting: Allergy and Immunology

## 2018-08-11 ENCOUNTER — Other Ambulatory Visit: Payer: Self-pay

## 2018-08-11 ENCOUNTER — Ambulatory Visit: Payer: Medicare Other | Admitting: Allergy and Immunology

## 2018-08-11 VITALS — BP 118/88 | HR 92 | Temp 98.6°F | Resp 20

## 2018-08-11 DIAGNOSIS — K219 Gastro-esophageal reflux disease without esophagitis: Secondary | ICD-10-CM | POA: Diagnosis not present

## 2018-08-11 DIAGNOSIS — J4520 Mild intermittent asthma, uncomplicated: Secondary | ICD-10-CM | POA: Diagnosis not present

## 2018-08-11 NOTE — Progress Notes (Signed)
[place]   Follow-up Note  Referring Provider: Cyndi Bender, PA-C Primary Provider: Cyndi Bender, PA-C Date of Office Visit: 08/11/2018  Subjective:   Scott Merritt (DOB: 08-15-1965) is a 53 y.o. male who returns to the [facility] on 08/11/2018 in re-evaluation of the following:  HPI: Woodie returns to this clinic in reevaluation of LPR and intermittent asthma.  His last visit to this clinic was 22 April 2018.  He has had an excellent interval of time without any significant respiratory tract symptoms.  He has a very good understanding at this point about how reflux was affecting his respiratory tract presenting as cough and other respiratory tract symptoms.  He has dramatically consolidated his caffeine consumption to consuming only a coffee or 2 in the morning.  He has eliminated all of his throat issues and he has been able to taper down his Protonix to every other day use.  He has not had to activate his "action plan".  Allergies as of 08/11/2018      Reactions   Prednisone Other (See Comments)   BLURRED VISION   Sudafed [pseudoephedrine Hcl] Hives   Lisinopril Cough   cough   Losartan Other (See Comments)   lethargic   Hydrocodone Nausea Only      Medication List      albuterol 108 (90 Base) MCG/ACT inhaler Commonly known as: VENTOLIN HFA Inhale 1-2 puffs into the lungs every 6 (six) hours as needed for wheezing or shortness of breath.   albuterol (2.5 MG/3ML) 0.083% nebulizer solution Commonly known as: PROVENTIL Take 2.5 mg by nebulization every 6 (six) hours as needed for wheezing or shortness of breath.   aspirin 325 MG tablet Take by mouth.   budesonide-formoterol 160-4.5 MCG/ACT inhaler Commonly known as: Symbicort Inhale two puffs twice daily as directed during respiratory flare-up.  Rinse, gargle, and spit after use.   clonazePAM 0.5 MG tablet Commonly known as: KLONOPIN Take 1 mg  by mouth at bedtime.   cyclobenzaprine 10 MG tablet Commonly known as: FLEXERIL Take by mouth.   diazepam 5 MG tablet Commonly known as: VALIUM TAKE 1 TABLET BY MOUTH EVERY SIX TO EIGHT HOURS AS NEEDED FOR SPASMS   fluticasone 50 MCG/ACT nasal spray Commonly known as: FLONASE SPRAY 1 SPRAY INTO EACH NOSTRIL EVERY DAY   gabapentin 300 MG capsule Commonly known as: NEURONTIN TAKE 1 CAPSULE BY MOUTH 3 TIMES A DAY AS NEEDED FOR PAIN   pantoprazole 40 MG tablet Commonly known as: PROTONIX Take 1 tablet (40 mg total) by mouth every morning.   QUEtiapine 50 MG tablet Commonly known as: SEROQUEL TAKE 1 TABLET BY MOUTH EVERYDAY AT BEDTIME   traMADol 50 MG tablet Commonly known as: ULTRAM TAKE 1 TO 2 TABLETS BY MOUTH TWICE A DAY AS NEEDED FOR PAIN       Past Medical History:  Diagnosis Date  . 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 Arthritis    left shoulder, hands  . Asthma    rare use of neb., none in > 1 yr.  [facility]land Kitchen GERD (gastroesophageal reflux disease)   . Headache   . History of bronchitis   . History of hiatal hernia   . Hypertension    under control with meds., has been on med. x 2 yr.  . Insomnia   . Rotator cuff tear 11/2012   left  . Stroke ([place]) 03/2012   residual balance  problems and memory problems  . 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 bicep rupture    . CHOLECYSTECTOMY    . COLONOSCOPY    . HERNIA REPAIR  2008  . LUMBAR LAMINECTOMY/DECOMPRESSION MICRODISCECTOMY Right 03/14/2009   L5-S1  . SACROILIAC JOINT FUSION Right 06/12/2016   Procedure: RIGHT SIDED SACROILIAC JOINT FUSION;  Surgeon: Phylliss Bob, MD;  Location: [facility];  Service: Orthopedics;  Laterality: Right;  RIGTH SIDED SACROILIAC JOINT FUSION  . [place]  . SHOULDER ARTHROSCOPY Right 11/23/2003  . SHOULDER ARTHROSCOPY WITH ROTATOR CUFF REPAIR AND SUBACROMIAL DECOMPRESSION Left 12/02/2012    Procedure: LEFT SHOULDER ARTHROSCOPY WITH SUBACROMIAL DECOMPRESSION, OPEN BICEPS TENODESIS, REPAIR OF PITA TEAR, DEBRIDEMENT OF SLAP TEAR;  Surgeon: Cammie Sickle., MD;  Location: [facility];  Service: Orthopedics;  Laterality: Left;  . TUMOR EXCISION  2007   pancreas    Review of systems negative except as noted in HPI / PMHx or noted below:  Review of Systems  Constitutional: Negative.   HENT: Negative.   Eyes: Negative.   Respiratory: Negative.   Cardiovascular: Negative.   Gastrointestinal: Negative.   Genitourinary: Negative.   Musculoskeletal: Negative.   Skin: Negative.   Neurological: Negative.   Endo/Heme/Allergies: Negative.   Psychiatric/Behavioral: Negative.      Objective:   Vitals:   08/11/18 0840  BP: 118/88  Pulse: 92  Resp: 20  Temp: 98.6 F (37 C)  SpO2: 94%          Physical Exam Constitutional:      Appearance: He is not diaphoretic.  HENT:     Head: Normocephalic.     Right Ear: External ear normal.     Left Ear: External ear normal.     Ears:     Comments: Bilateral hearing aid    Nose: Nose normal. No mucosal edema or rhinorrhea.     Mouth/Throat:     Pharynx: Uvula midline. No oropharyngeal exudate.  Eyes:     Conjunctiva/sclera: Conjunctivae normal.  Neck:     Thyroid: No thyromegaly.     Trachea: Trachea normal. No tracheal tenderness or tracheal deviation.  Cardiovascular:     Rate and Rhythm: Normal rate and regular rhythm.     Heart sounds: Normal heart sounds, S1 normal and S2 normal. No murmur.  Pulmonary:     Effort: No respiratory distress.     Breath sounds: Normal breath sounds. No stridor. No wheezing or rales.  Lymphadenopathy:     Head:     Right side of head: No tonsillar adenopathy.     Left side of head: No tonsillar adenopathy.     Cervical: No cervical adenopathy.  Skin:    Findings: No erythema or rash.     Nails: There is no clubbing.   Neurological:     Mental Status: He is alert.      Diagnostics:    Spirometry was performed and demonstrated an FEV1 of 3.46 at 94 % of predicted.   Assessment and Plan:   1. Asthma, mild intermittent, well-controlled   2. LPRD (laryngopharyngeal reflux disease)      1.  Continue to Treat LPR with the following:   A.  Consolidate caffeine and chocolate consumption  B.  Protonix 40 mg tablet 3-7 times per week  2.  "Action plan" for respiratory flareup:   A.  Initiate treatment for LPR  B.  Symbicort 160-2 inhalations twice a day with spacer  C.  Proventil HFA or similar to inhalations every 4-6 hours  D.  OTC Mucinex DM 2 tablets twice a day  3.  Return to clinic in 12 months or earlier if problem  4. Obtain fall flu vaccine  Kevis appears to be doing very well on his current therapy which includes treatment directed against LPR.  He has a very good understanding of his disease state and the appropriate dosing of his medications and we will now see him back in this clinic in 12 months or earlier if there is a problem.  Allena Katz, MD Allergy / Immunology [place]

## 2018-08-11 NOTE — Patient Instructions (Addendum)
  1.  Continue to Treat LPR with the following:   A.  Consolidate caffeine and chocolate consumption  B.  Protonix 40 mg tablet 3-7 times per week  2.  "Action plan" for respiratory flareup:   A.  Initiate treatment for LPR  B.  Symbicort 160-2 inhalations twice a day with spacer  C.  Proventil HFA or similar to inhalations every 4-6 hours  D.  OTC Mucinex DM 2 tablets twice a day  3.  Return to clinic in 12 months or earlier if problem  4. Obtain fall flu vaccine

## 2018-08-12 ENCOUNTER — Encounter: Payer: Self-pay | Admitting: Allergy and Immunology

## 2019-08-11 ENCOUNTER — Ambulatory Visit: Payer: Medicare Other | Admitting: Allergy and Immunology

## 2019-08-25 DIAGNOSIS — Z79899 Other long term (current) drug therapy: Secondary | ICD-10-CM | POA: Diagnosis not present

## 2019-08-25 DIAGNOSIS — R7303 Prediabetes: Secondary | ICD-10-CM | POA: Diagnosis not present

## 2019-08-25 DIAGNOSIS — G4700 Insomnia, unspecified: Secondary | ICD-10-CM | POA: Diagnosis not present

## 2019-08-25 DIAGNOSIS — I10 Essential (primary) hypertension: Secondary | ICD-10-CM | POA: Diagnosis not present

## 2019-09-21 DIAGNOSIS — M47816 Spondylosis without myelopathy or radiculopathy, lumbar region: Secondary | ICD-10-CM | POA: Diagnosis not present

## 2019-10-17 DIAGNOSIS — M47812 Spondylosis without myelopathy or radiculopathy, cervical region: Secondary | ICD-10-CM | POA: Diagnosis not present

## 2019-10-17 DIAGNOSIS — M47816 Spondylosis without myelopathy or radiculopathy, lumbar region: Secondary | ICD-10-CM | POA: Diagnosis not present

## 2019-11-08 DIAGNOSIS — M47812 Spondylosis without myelopathy or radiculopathy, cervical region: Secondary | ICD-10-CM | POA: Diagnosis not present

## 2019-11-10 DIAGNOSIS — I10 Essential (primary) hypertension: Secondary | ICD-10-CM | POA: Diagnosis not present

## 2019-12-02 DIAGNOSIS — G8929 Other chronic pain: Secondary | ICD-10-CM | POA: Diagnosis not present

## 2019-12-02 DIAGNOSIS — M545 Low back pain, unspecified: Secondary | ICD-10-CM | POA: Diagnosis not present

## 2019-12-02 DIAGNOSIS — Z79899 Other long term (current) drug therapy: Secondary | ICD-10-CM | POA: Diagnosis not present

## 2020-02-20 DIAGNOSIS — Z20822 Contact with and (suspected) exposure to covid-19: Secondary | ICD-10-CM | POA: Diagnosis not present

## 2020-02-22 ENCOUNTER — Other Ambulatory Visit (HOSPITAL_COMMUNITY): Payer: Self-pay

## 2020-02-23 ENCOUNTER — Telehealth: Payer: Self-pay | Admitting: Unknown Physician Specialty

## 2020-02-23 ENCOUNTER — Other Ambulatory Visit: Payer: Self-pay | Admitting: Unknown Physician Specialty

## 2020-02-23 NOTE — Telephone Encounter (Signed)
Called to discuss with patient about Covid symptoms and the use of a monoclonal antibody infusion for those with mild to moderate Covid symptoms and at a high risk of hospitalization.   Pt is qualified for the monoclonal antibody infusion, but due to medication shortages we cannot offer the pt the infusion at this time. This decision was based on the the NIH Covid 19 treatment guidelines for treatment prioritization and equitable access. Symptoms tier reviewed as well as criteria for ending isolation. Preventative practices reviewed. Patient verbalized understanding.  Gabriel Cirri, NP  02/23/2020 10:04 AM

## 2020-03-14 DIAGNOSIS — H6122 Impacted cerumen, left ear: Secondary | ICD-10-CM | POA: Diagnosis not present

## 2020-03-14 DIAGNOSIS — T161XXA Foreign body in right ear, initial encounter: Secondary | ICD-10-CM | POA: Diagnosis not present

## 2020-03-14 DIAGNOSIS — H903 Sensorineural hearing loss, bilateral: Secondary | ICD-10-CM | POA: Diagnosis not present

## 2020-03-16 DIAGNOSIS — Z79899 Other long term (current) drug therapy: Secondary | ICD-10-CM | POA: Diagnosis not present

## 2020-03-16 DIAGNOSIS — H905 Unspecified sensorineural hearing loss: Secondary | ICD-10-CM | POA: Diagnosis not present

## 2020-03-16 DIAGNOSIS — M545 Low back pain, unspecified: Secondary | ICD-10-CM | POA: Diagnosis not present

## 2020-03-16 DIAGNOSIS — G4700 Insomnia, unspecified: Secondary | ICD-10-CM | POA: Diagnosis not present

## 2020-03-16 DIAGNOSIS — R7303 Prediabetes: Secondary | ICD-10-CM | POA: Diagnosis not present

## 2020-03-16 DIAGNOSIS — I10 Essential (primary) hypertension: Secondary | ICD-10-CM | POA: Diagnosis not present

## 2020-03-16 DIAGNOSIS — G8929 Other chronic pain: Secondary | ICD-10-CM | POA: Diagnosis not present

## 2020-06-04 DIAGNOSIS — E785 Hyperlipidemia, unspecified: Secondary | ICD-10-CM | POA: Diagnosis not present

## 2020-06-04 DIAGNOSIS — Z9181 History of falling: Secondary | ICD-10-CM | POA: Diagnosis not present

## 2020-06-04 DIAGNOSIS — Z0000 Encounter for general adult medical examination without abnormal findings: Secondary | ICD-10-CM | POA: Diagnosis not present

## 2020-06-15 DIAGNOSIS — M545 Low back pain, unspecified: Secondary | ICD-10-CM | POA: Diagnosis not present

## 2020-06-15 DIAGNOSIS — I10 Essential (primary) hypertension: Secondary | ICD-10-CM | POA: Diagnosis not present

## 2020-06-15 DIAGNOSIS — G8929 Other chronic pain: Secondary | ICD-10-CM | POA: Diagnosis not present

## 2020-06-15 DIAGNOSIS — Z79899 Other long term (current) drug therapy: Secondary | ICD-10-CM | POA: Diagnosis not present

## 2020-07-31 DIAGNOSIS — M549 Dorsalgia, unspecified: Secondary | ICD-10-CM | POA: Diagnosis not present

## 2020-07-31 DIAGNOSIS — Z79899 Other long term (current) drug therapy: Secondary | ICD-10-CM | POA: Diagnosis not present

## 2020-07-31 DIAGNOSIS — Z1389 Encounter for screening for other disorder: Secondary | ICD-10-CM | POA: Diagnosis not present

## 2020-07-31 DIAGNOSIS — G894 Chronic pain syndrome: Secondary | ICD-10-CM | POA: Diagnosis not present

## 2020-08-21 DIAGNOSIS — M549 Dorsalgia, unspecified: Secondary | ICD-10-CM | POA: Diagnosis not present

## 2020-09-12 DIAGNOSIS — K1321 Leukoplakia of oral mucosa, including tongue: Secondary | ICD-10-CM | POA: Diagnosis not present

## 2020-09-12 DIAGNOSIS — H6123 Impacted cerumen, bilateral: Secondary | ICD-10-CM | POA: Diagnosis not present

## 2020-09-12 DIAGNOSIS — H903 Sensorineural hearing loss, bilateral: Secondary | ICD-10-CM | POA: Diagnosis not present

## 2020-09-17 DIAGNOSIS — R7303 Prediabetes: Secondary | ICD-10-CM | POA: Diagnosis not present

## 2020-09-17 DIAGNOSIS — G8929 Other chronic pain: Secondary | ICD-10-CM | POA: Diagnosis not present

## 2020-09-17 DIAGNOSIS — I10 Essential (primary) hypertension: Secondary | ICD-10-CM | POA: Diagnosis not present

## 2020-09-17 DIAGNOSIS — G4700 Insomnia, unspecified: Secondary | ICD-10-CM | POA: Diagnosis not present

## 2020-09-17 DIAGNOSIS — M545 Low back pain, unspecified: Secondary | ICD-10-CM | POA: Diagnosis not present

## 2020-09-19 DIAGNOSIS — M549 Dorsalgia, unspecified: Secondary | ICD-10-CM | POA: Diagnosis not present

## 2020-09-19 DIAGNOSIS — Z79899 Other long term (current) drug therapy: Secondary | ICD-10-CM | POA: Diagnosis not present

## 2020-09-19 DIAGNOSIS — Z1389 Encounter for screening for other disorder: Secondary | ICD-10-CM | POA: Diagnosis not present

## 2020-09-19 DIAGNOSIS — G894 Chronic pain syndrome: Secondary | ICD-10-CM | POA: Diagnosis not present

## 2020-10-25 DIAGNOSIS — M549 Dorsalgia, unspecified: Secondary | ICD-10-CM | POA: Diagnosis not present

## 2020-10-25 DIAGNOSIS — Z79891 Long term (current) use of opiate analgesic: Secondary | ICD-10-CM | POA: Diagnosis not present

## 2020-10-25 DIAGNOSIS — G894 Chronic pain syndrome: Secondary | ICD-10-CM | POA: Diagnosis not present

## 2020-11-22 DIAGNOSIS — M47816 Spondylosis without myelopathy or radiculopathy, lumbar region: Secondary | ICD-10-CM | POA: Diagnosis not present

## 2020-11-22 DIAGNOSIS — Z79891 Long term (current) use of opiate analgesic: Secondary | ICD-10-CM | POA: Diagnosis not present

## 2020-11-22 DIAGNOSIS — G894 Chronic pain syndrome: Secondary | ICD-10-CM | POA: Diagnosis not present

## 2020-11-22 DIAGNOSIS — Z9889 Other specified postprocedural states: Secondary | ICD-10-CM | POA: Diagnosis not present

## 2020-12-29 DIAGNOSIS — M5416 Radiculopathy, lumbar region: Secondary | ICD-10-CM | POA: Diagnosis not present

## 2020-12-29 DIAGNOSIS — M545 Low back pain, unspecified: Secondary | ICD-10-CM | POA: Diagnosis not present

## 2020-12-29 DIAGNOSIS — M542 Cervicalgia: Secondary | ICD-10-CM | POA: Diagnosis not present

## 2020-12-29 DIAGNOSIS — M5412 Radiculopathy, cervical region: Secondary | ICD-10-CM | POA: Diagnosis not present

## 2021-01-02 ENCOUNTER — Other Ambulatory Visit: Payer: Self-pay | Admitting: Orthopedic Surgery

## 2021-01-02 DIAGNOSIS — M545 Low back pain, unspecified: Secondary | ICD-10-CM

## 2021-01-02 DIAGNOSIS — M542 Cervicalgia: Secondary | ICD-10-CM

## 2021-01-03 DIAGNOSIS — M47816 Spondylosis without myelopathy or radiculopathy, lumbar region: Secondary | ICD-10-CM | POA: Diagnosis not present

## 2021-01-03 DIAGNOSIS — Z79891 Long term (current) use of opiate analgesic: Secondary | ICD-10-CM | POA: Diagnosis not present

## 2021-01-03 DIAGNOSIS — Z9889 Other specified postprocedural states: Secondary | ICD-10-CM | POA: Diagnosis not present

## 2021-01-03 DIAGNOSIS — Z79899 Other long term (current) drug therapy: Secondary | ICD-10-CM | POA: Diagnosis not present

## 2021-01-03 DIAGNOSIS — G894 Chronic pain syndrome: Secondary | ICD-10-CM | POA: Diagnosis not present

## 2021-01-21 ENCOUNTER — Ambulatory Visit
Admission: RE | Admit: 2021-01-21 | Discharge: 2021-01-21 | Disposition: A | Discharge status: Home / Self Care | Payer: Medicare Other | Source: Ambulatory Visit | Attending: Orthopedic Surgery | Admitting: Orthopedic Surgery

## 2021-01-21 ENCOUNTER — Other Ambulatory Visit: Payer: Self-pay

## 2021-01-21 DIAGNOSIS — M542 Cervicalgia: Secondary | ICD-10-CM

## 2021-01-21 DIAGNOSIS — M4802 Spinal stenosis, cervical region: Secondary | ICD-10-CM | POA: Diagnosis not present

## 2021-01-21 DIAGNOSIS — M545 Low back pain, unspecified: Secondary | ICD-10-CM

## 2021-01-21 DIAGNOSIS — M47816 Spondylosis without myelopathy or radiculopathy, lumbar region: Secondary | ICD-10-CM | POA: Diagnosis not present

## 2021-01-25 DIAGNOSIS — R519 Headache, unspecified: Secondary | ICD-10-CM | POA: Diagnosis not present

## 2021-01-25 DIAGNOSIS — M545 Low back pain, unspecified: Secondary | ICD-10-CM | POA: Diagnosis not present

## 2021-01-25 DIAGNOSIS — M25511 Pain in right shoulder: Secondary | ICD-10-CM | POA: Diagnosis not present

## 2021-01-25 DIAGNOSIS — M542 Cervicalgia: Secondary | ICD-10-CM | POA: Diagnosis not present

## 2021-01-29 DIAGNOSIS — M5412 Radiculopathy, cervical region: Secondary | ICD-10-CM | POA: Diagnosis not present

## 2021-01-31 DIAGNOSIS — G894 Chronic pain syndrome: Secondary | ICD-10-CM | POA: Diagnosis not present

## 2021-01-31 DIAGNOSIS — Z9889 Other specified postprocedural states: Secondary | ICD-10-CM | POA: Diagnosis not present

## 2021-01-31 DIAGNOSIS — Z79899 Other long term (current) drug therapy: Secondary | ICD-10-CM | POA: Diagnosis not present

## 2021-01-31 DIAGNOSIS — M47816 Spondylosis without myelopathy or radiculopathy, lumbar region: Secondary | ICD-10-CM | POA: Diagnosis not present

## 2021-01-31 DIAGNOSIS — Z79891 Long term (current) use of opiate analgesic: Secondary | ICD-10-CM | POA: Diagnosis not present

## 2021-01-31 DIAGNOSIS — K5903 Drug induced constipation: Secondary | ICD-10-CM | POA: Diagnosis not present

## 2021-01-31 DIAGNOSIS — M5481 Occipital neuralgia: Secondary | ICD-10-CM | POA: Diagnosis not present
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
## 2021-02-06 ENCOUNTER — Telehealth: Payer: Self-pay

## 2021-02-06 ENCOUNTER — Encounter: Payer: Self-pay | Admitting: Psychiatry

## 2021-02-06 ENCOUNTER — Other Ambulatory Visit: Payer: Self-pay

## 2021-02-06 ENCOUNTER — Ambulatory Visit: Payer: Medicare Other | Admitting: Psychiatry

## 2021-02-06 VITALS — BP 157/99 | HR 86 | Ht 70.0 in | Wt 241.0 lb

## 2021-02-06 DIAGNOSIS — M5412 Radiculopathy, cervical region: Secondary | ICD-10-CM | POA: Diagnosis not present

## 2021-02-06 DIAGNOSIS — G43009 Migraine without aura, not intractable, without status migrainosus: Secondary | ICD-10-CM

## 2021-02-06 DIAGNOSIS — M5481 Occipital neuralgia: Secondary | ICD-10-CM

## 2021-02-06 MED ORDER — NURTEC 75 MG PO TBDP: 75.0000 mg | ORAL_TABLET | Freq: Every day | ORAL | 2 refills | Status: DC | PRN

## 2021-02-06 MED ORDER — TOPIRAMATE 25 MG PO TABS: ORAL_TABLET | ORAL | 3 refills | Status: DC

## 2021-02-06 NOTE — Telephone Encounter (Signed)
PA for Nurtec has been approved as of 02/06/2021. Request Reference Number: [id number]. NURTEC TAB 75MG  ODT is approved through 02/23/2022. Your patient may now fill this prescription and it will be covered.

## 2021-02-06 NOTE — Patient Instructions (Addendum)
Start Topamax for headache prevention. Take 25 mg (1 pill) at bedtime for one week, then increase to 50 mg (2 pills) at bedtime for one week, then take 75 mg (3 pills) at bedtime for one week, then take 100 mg (4 pills) at bedtime  2.  Take Nurtec as needed for headache. Take at first sign of headache. Do not take more than one pill in 24 hours  3.  Schedule occipital nerve block

## 2021-02-06 NOTE — Telephone Encounter (Signed)
(  Key: [id number])  [employer] is reviewing your PA request. Typically an electronic response will be received within 24-72 hours. To check for an update later, open this request from your dashboard.  You may close this dialog and return to your dashboard to perform other tasks.

## 2021-02-08 DIAGNOSIS — M5412 Radiculopathy, cervical region: Secondary | ICD-10-CM | POA: Diagnosis not present

## 2021-02-11 DIAGNOSIS — M5412 Radiculopathy, cervical region: Secondary | ICD-10-CM | POA: Diagnosis not present

## 2021-02-13 ENCOUNTER — Ambulatory Visit: Payer: Medicare Other | Admitting: Psychiatry

## 2021-02-13 VITALS — BP 179/109 | HR 97

## 2021-02-13 DIAGNOSIS — M5481 Occipital neuralgia: Secondary | ICD-10-CM

## 2021-02-13 NOTE — Progress Notes (Signed)
Procedure: Occipital Nerve injection   Location: right occiput  The risks, benefits and anticipated outcomes of the procedure, the risks and benefits of the alternatives to the procedure, and the roles and tasks of the personnel to be involved, were discussed with the patient, and the patient consents to the procedure and agrees to proceed.     A combination of 1 cc betamethasone 6 mg and 4 cc of 0.25% bupivacaine were prepared in 2 syringes (5 cc).  The right greater occipital nerve was injected 3cm caudal and 1.5 cm lateral to the inion where the main trunk of the occipital nerve penetrates the semispinalis muscle.  The needle was placed perpendicular and the needle advanced 1.5 cm. After aspiration to ensure no obstruction or presence of blood, the area was injected.  The needle was repositioned in a fan-like manner and the entire area was injected. 5 cc of betamethasone and bupivacaine solution was discarded. Pressure was held and no hematoma was noted. The patient was told to use heat if there was discomfort later in the day.    Genia Harold, MD 02/13/21 3:49 PM  Bupivacaine lot #: [id number]. Exp 02/2022 Betamethason lot # [id number]. Exp 02/2022

## 2021-02-21 DIAGNOSIS — M5412 Radiculopathy, cervical region: Secondary | ICD-10-CM | POA: Diagnosis not present

## 2021-02-26 DIAGNOSIS — M5481 Occipital neuralgia: Secondary | ICD-10-CM | POA: Diagnosis not present

## 2021-02-28 DIAGNOSIS — Z79891 Long term (current) use of opiate analgesic: Secondary | ICD-10-CM | POA: Diagnosis not present

## 2021-02-28 DIAGNOSIS — Z9889 Other specified postprocedural states: Secondary | ICD-10-CM | POA: Diagnosis not present

## 2021-02-28 DIAGNOSIS — G894 Chronic pain syndrome: Secondary | ICD-10-CM | POA: Diagnosis not present

## 2021-02-28 DIAGNOSIS — M47816 Spondylosis without myelopathy or radiculopathy, lumbar region: Secondary | ICD-10-CM | POA: Diagnosis not present

## 2021-02-28 DIAGNOSIS — M5481 Occipital neuralgia: Secondary | ICD-10-CM | POA: Diagnosis not present

## 2021-02-28 DIAGNOSIS — K5903 Drug induced constipation: Secondary | ICD-10-CM | POA: Diagnosis not present

## 2021-02-28 DIAGNOSIS — Z79899 Other long term (current) drug therapy: Secondary | ICD-10-CM | POA: Diagnosis not present

## 2021-03-01 DIAGNOSIS — M5412 Radiculopathy, cervical region: Secondary | ICD-10-CM | POA: Diagnosis not present

## 2021-03-04 DIAGNOSIS — M5412 Radiculopathy, cervical region: Secondary | ICD-10-CM | POA: Diagnosis not present

## 2021-03-21 DIAGNOSIS — M545 Low back pain, unspecified: Secondary | ICD-10-CM | POA: Diagnosis not present

## 2021-03-21 DIAGNOSIS — Z23 Encounter for immunization: Secondary | ICD-10-CM | POA: Diagnosis not present

## 2021-03-21 DIAGNOSIS — R7303 Prediabetes: Secondary | ICD-10-CM | POA: Diagnosis not present

## 2021-03-21 DIAGNOSIS — G8929 Other chronic pain: Secondary | ICD-10-CM | POA: Diagnosis not present

## 2021-03-21 DIAGNOSIS — G4700 Insomnia, unspecified: Secondary | ICD-10-CM | POA: Diagnosis not present

## 2021-03-21 DIAGNOSIS — Z1211 Encounter for screening for malignant neoplasm of colon: Secondary | ICD-10-CM | POA: Diagnosis not present

## 2021-03-21 DIAGNOSIS — I10 Essential (primary) hypertension: Secondary | ICD-10-CM | POA: Diagnosis not present

## 2021-04-04 DIAGNOSIS — Z9889 Other specified postprocedural states: Secondary | ICD-10-CM | POA: Diagnosis not present

## 2021-04-04 DIAGNOSIS — M5481 Occipital neuralgia: Secondary | ICD-10-CM | POA: Diagnosis not present

## 2021-04-04 DIAGNOSIS — Z79891 Long term (current) use of opiate analgesic: Secondary | ICD-10-CM | POA: Diagnosis not present

## 2021-04-04 DIAGNOSIS — M47816 Spondylosis without myelopathy or radiculopathy, lumbar region: Secondary | ICD-10-CM | POA: Diagnosis not present

## 2021-04-16 ENCOUNTER — Encounter: Payer: Self-pay | Admitting: Psychiatry

## 2021-05-09 DIAGNOSIS — H6121 Impacted cerumen, right ear: Secondary | ICD-10-CM | POA: Diagnosis not present

## 2021-05-09 DIAGNOSIS — H6122 Impacted cerumen, left ear: Secondary | ICD-10-CM | POA: Diagnosis not present

## 2021-05-16 ENCOUNTER — Ambulatory Visit: Payer: Medicare Other | Admitting: Psychiatry

## 2021-05-16 ENCOUNTER — Encounter: Payer: Self-pay | Admitting: Psychiatry

## 2021-05-16 VITALS — BP 140/80 | HR 86 | Ht 70.5 in | Wt 235.4 lb

## 2021-05-16 DIAGNOSIS — Z9889 Other specified postprocedural states: Secondary | ICD-10-CM | POA: Diagnosis not present

## 2021-05-16 DIAGNOSIS — M5481 Occipital neuralgia: Secondary | ICD-10-CM | POA: Diagnosis not present

## 2021-05-16 DIAGNOSIS — G43009 Migraine without aura, not intractable, without status migrainosus: Secondary | ICD-10-CM

## 2021-05-16 DIAGNOSIS — M47816 Spondylosis without myelopathy or radiculopathy, lumbar region: Secondary | ICD-10-CM | POA: Diagnosis not present

## 2021-05-16 DIAGNOSIS — Z79891 Long term (current) use of opiate analgesic: Secondary | ICD-10-CM | POA: Diagnosis not present

## 2021-05-16 MED ORDER — DICLOFENAC POTASSIUM 50 MG PO TABS: ORAL_TABLET | ORAL | 3 refills | Status: DC

## 2021-05-16 NOTE — Progress Notes (Signed)
? ?  CC:  headaches ? ?Follow-up Visit ? ?Last visit: 02/06/21 ? ?Brief HPI: ?56 year old male with a history of HTN, prediabetes, CVA 2014, asthma who presents for follow up of occipital neuralgia. ? ?He was started on Topamax for prevention and Nurtec for rescue at his last visit. Underwent occipital nerve block 02/13/21. ? ?Interval History: ?His headache resolved with occipital nerve block. Was pain free for 2 months, then pain slowly returned. He has had 2 headaches this month. Did recently have one severe headache with photophobia and phonophobia. Takes Aleve as needed for his headaches, which only helps somewhat. Takes tizanidine at night as well. ? ?Couldn't afford Nurtec even with insurance approval. Took Topamax for one week, but headaches had improved with nerve block so he stopped it. He would prefer not to take a daily medication if he can avoid it. ? ?Headache days per month: 2 ?Headache free days per month: 28 ? ?Current Headache Regimen: ?Preventative: none ?Abortive: Aleve ? ? ?Prior Therapies                                  ?Gabapentin 300 mg TID - cognitive changes ?Lisinopril - cough ?Losartan - lethargy ?Tizanidine 4 mg PRN ? ?Physical Exam:  ? ?Vital Signs: ?BP 140/80 Comment: manual  Pulse 86   Ht 5' 10.5" (1.791 m)   Wt 235 lb 6.4 oz (106.8 kg)   BMI 33.30 kg/m?  ?GENERAL:  well appearing, in no acute distress, alert  ?SKIN:  Color, texture, turgor normal. No rashes or lesions ?HEAD:  Normocephalic/atraumatic. ?RESP: normal respiratory effort ?MSK:  +tenderness to palpation over bilateral occiput ? ?NEUROLOGICAL: ?Mental Status: Alert, oriented to person, place and time, Follows commands, and Speech fluent and appropriate. ?Cranial Nerves: PERRL, face symmetric, no dysarthria, hearing grossly intact ?Motor: moves all extremities equally ?Gait: normal-based. ? ?IMPRESSION: ?56 year old male with a history of HTN, prediabetes, CVA 2014, asthma who presents for follow up of bilateral  occipital neuralgia and migraines. Occipital nerve block completely resolved his headache for 2 months and pain has gradually started to return. He would prefer to avoid daily medications if possible. Will schedule for repeat nerve block. He is unable to afford gepants for migraine rescue. Will start diclofenac as needed for migraine in place of Aleve for his severe headaches. Counseled on using this sparingly. ? ?PLAN: ?-Will plan for repeat nerve block ?-Take diclofenac 50-100 mg as needed for migraine ?-Next steps: consider SNRI, protryptiline, Topamax, baclofen ? ?Follow-up: for occipital nerve block ? ?I spent a total of 21 minutes on the date of the service. Headache education was done. Discussed lifestyle modification including increased oral hydration, decreased caffeine, exercise and stress management. Discussed treatment options including preventive and acute medications, natural supplements, and infusion therapy. Discussed medication overuse headache and to limit use of acute treatments to no more than 2 days/week or 10 days/month. Discussed medication side effects, adverse reactions and drug interactions. Written educational materials and patient instructions outlining all of the above were given. ? ?Genia Harold, MD ?05/16/21 ?9:30 AM ? ?

## 2021-05-16 NOTE — Patient Instructions (Addendum)
Take diclofenac 50-100 mg as needed for migraine ?Schedule for occipital nerve block ?

## 2021-05-29 ENCOUNTER — Ambulatory Visit: Payer: Medicare Other | Admitting: Psychiatry

## 2021-05-29 VITALS — BP 162/70 | HR 68

## 2021-05-29 DIAGNOSIS — M5481 Occipital neuralgia: Secondary | ICD-10-CM | POA: Diagnosis not present

## 2021-05-29 DIAGNOSIS — M542 Cervicalgia: Secondary | ICD-10-CM | POA: Diagnosis not present

## 2021-05-29 NOTE — Progress Notes (Addendum)
Procedure: Occipital Nerve block/Trigger point injection ? ?Location: bilateral occiput ? ?The risks, benefits and anticipated outcomes of the procedure, the risks and benefits of the alternatives to the procedure, and the roles and tasks of the personnel to be involved, were discussed with the patient, and the patient consents to the procedure and agrees to proceed.   ?  ?A combination of 1 cc betamethasone 6 mg (lot [id number], exp 04/2022) and 4 cc of 0.25% bupivacaine (lot [id number], exp 09/25/22)  were prepared in 2 syringes (5 cc).  2 trigger points on the semispinalis capitus were identified and injected. The left and right greater occipital nerves were injected 3cm caudal and 1.5 cm lateral to the inion where the main trunk of the occipital nerve penetrates the semispinalis muscle.  The needle was placed perpendicular and the needle advanced 1.5 cm. After aspiration to ensure no obstruction or presence of blood, the area was injected.  The needle was repositioned in a fan-like manner and the entire area was injected. Pressure was held and no hematoma was noted.  ? ?Genia Harold, MD ?05/29/21 ?9:14 AM ? ?

## 2021-06-03 ENCOUNTER — Encounter: Payer: Self-pay | Admitting: Psychiatry

## 2021-06-24 DIAGNOSIS — Z0000 Encounter for general adult medical examination without abnormal findings: Secondary | ICD-10-CM | POA: Diagnosis not present

## 2021-06-24 DIAGNOSIS — Z9181 History of falling: Secondary | ICD-10-CM | POA: Diagnosis not present

## 2021-06-24 DIAGNOSIS — E785 Hyperlipidemia, unspecified: Secondary | ICD-10-CM | POA: Diagnosis not present

## 2021-06-24 DIAGNOSIS — Z139 Encounter for screening, unspecified: Secondary | ICD-10-CM | POA: Diagnosis not present

## 2021-07-11 DIAGNOSIS — G894 Chronic pain syndrome: Secondary | ICD-10-CM | POA: Diagnosis not present

## 2021-07-11 DIAGNOSIS — M5481 Occipital neuralgia: Secondary | ICD-10-CM | POA: Diagnosis not present

## 2021-07-11 DIAGNOSIS — Z9889 Other specified postprocedural states: Secondary | ICD-10-CM | POA: Diagnosis not present

## 2021-07-11 DIAGNOSIS — Z79891 Long term (current) use of opiate analgesic: Secondary | ICD-10-CM | POA: Diagnosis not present

## 2021-07-11 DIAGNOSIS — M47816 Spondylosis without myelopathy or radiculopathy, lumbar region: Secondary | ICD-10-CM | POA: Diagnosis not present

## 2021-07-28 ENCOUNTER — Encounter: Payer: Self-pay | Admitting: Psychiatry

## 2021-07-29 NOTE — Telephone Encounter (Signed)
He can get the next one July 5th or any time after that

## 2021-08-29 DIAGNOSIS — M47816 Spondylosis without myelopathy or radiculopathy, lumbar region: Secondary | ICD-10-CM | POA: Diagnosis not present

## 2021-08-29 DIAGNOSIS — Z79891 Long term (current) use of opiate analgesic: Secondary | ICD-10-CM | POA: Diagnosis not present

## 2021-08-29 DIAGNOSIS — Z1389 Encounter for screening for other disorder: Secondary | ICD-10-CM | POA: Diagnosis not present

## 2021-08-29 DIAGNOSIS — M5481 Occipital neuralgia: Secondary | ICD-10-CM | POA: Diagnosis not present

## 2021-08-29 DIAGNOSIS — Z9889 Other specified postprocedural states: Secondary | ICD-10-CM | POA: Diagnosis not present

## 2021-08-29 DIAGNOSIS — K5903 Drug induced constipation: Secondary | ICD-10-CM | POA: Diagnosis not present

## 2021-09-11 ENCOUNTER — Telehealth: Payer: Self-pay | Admitting: *Deleted

## 2021-09-11 ENCOUNTER — Ambulatory Visit: Payer: Medicare Other | Admitting: Psychiatry

## 2021-09-11 DIAGNOSIS — M542 Cervicalgia: Secondary | ICD-10-CM

## 2021-09-11 DIAGNOSIS — M5481 Occipital neuralgia: Secondary | ICD-10-CM

## 2021-09-11 MED ORDER — BACLOFEN 10 MG PO TABS: 10.0000 mg | ORAL_TABLET | Freq: Three times a day (TID) | ORAL | 6 refills | Status: DC | PRN

## 2021-09-11 MED ORDER — EMGALITY 120 MG/ML ~~LOC~~ SOAJ: 2.0000 | Freq: Once | SUBCUTANEOUS | 0 refills | Status: AC

## 2021-09-11 MED ORDER — DICLOFENAC POTASSIUM 50 MG PO TABS: ORAL_TABLET | ORAL | 3 refills | Status: DC

## 2021-09-11 MED ORDER — EMGALITY 120 MG/ML ~~LOC~~ SOAJ: 1.0000 | SUBCUTANEOUS | 6 refills | Status: DC

## 2021-09-11 NOTE — Telephone Encounter (Signed)
Emgality PA, (Key: [id number])

## 2021-09-11 NOTE — Patient Instructions (Addendum)
Start Emgality for migraine prevention. Can go to [email or web address] to apply for a savings card  Take baclofen as needed for neck and muscle spasms

## 2021-09-11 NOTE — Telephone Encounter (Signed)
Your information has been sent to [employer].

## 2021-09-11 NOTE — Telephone Encounter (Signed)
EMGALITY INJ '120MG'$ /ML, use as directed, is approved through 02/23/2022 under your Medicare Part D benefit. Faxed approval letter to pharmacy.

## 2021-09-11 NOTE — Progress Notes (Signed)
Procedure: Occipital Nerve injection/Trigger point injection  Location: bilateral occiput  The risks, benefits and anticipated outcomes of the procedure, the risks and benefits of the alternatives to the procedure, and the roles and tasks of the personnel to be involved, were discussed with the patient, and the patient consents to the procedure and agrees to proceed.     A combination of 1 cc betamethasone 6 mg and 4 cc of 0.25% bupivacaine were prepared in 2 syringes (5 cc).  2 trigger points on the splenius capitus were identified and injected. The bilateral greater occipital nerves were injected 3cm caudal and 1.5 cm lateral to the inion where the main trunk of the occipital nerve penetrates the semispinalis muscle.  The needle was placed perpendicular and the needle advanced 1.5 cm. After aspiration to ensure no obstruction or presence of blood, the area was injected.  The needle was repositioned in a fan-like manner and the entire area was injected. Pressure was held and no hematoma was noted.   Patient continues to have migraines, with at least 7 migraines in the past month. He has failed Topamax, losartan, lisinopril, and gabapentin previously. Will start Emgality for migraine prevention. Baclofen prescribed for neck tension.  Plan: Start Emgality 120 mg monthly Start baclofen 10 mg TID PRN  Genia Harold, MD 09/11/21 2:58 PM

## 2021-09-11 NOTE — Telephone Encounter (Signed)
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 I'm hoping that plus the Topamax will be enough to get it approved

## 2021-09-16 ENCOUNTER — Encounter: Payer: Self-pay | Admitting: Psychiatry

## 2021-09-19 ENCOUNTER — Ambulatory Visit: Payer: Medicare Other | Admitting: Psychiatry

## 2021-09-23 DIAGNOSIS — R7303 Prediabetes: Secondary | ICD-10-CM | POA: Diagnosis not present

## 2021-09-23 DIAGNOSIS — R0982 Postnasal drip: Secondary | ICD-10-CM | POA: Diagnosis not present

## 2021-09-23 DIAGNOSIS — M545 Low back pain, unspecified: Secondary | ICD-10-CM | POA: Diagnosis not present

## 2021-09-23 DIAGNOSIS — G4700 Insomnia, unspecified: Secondary | ICD-10-CM | POA: Diagnosis not present

## 2021-09-23 DIAGNOSIS — G8929 Other chronic pain: Secondary | ICD-10-CM | POA: Diagnosis not present

## 2021-12-12 ENCOUNTER — Other Ambulatory Visit: Payer: Self-pay | Admitting: Psychiatry

## 2022-01-01 NOTE — Progress Notes (Unsigned)
Procedure: Occipital Nerve injection/Trigger point injection  Location: bilateral occiput  The risks, benefits and anticipated outcomes of the procedure, the risks and benefits of the alternatives to the procedure, and the roles and tasks of the personnel to be involved, were discussed with the patient, and the patient consents to the procedure and agrees to proceed.     A combination of 1 cc betamethasone 6 mg and 4 cc of 0.25% bupivacaine were prepared in 2 syringes (5 cc).  2 trigger points on the splenius capitus were identified and injected. The left and right greater occipital nerves were injected 3cm caudal and 1.5 cm lateral to the inion where the main trunk of the occipital nerve penetrates the semispinalis muscle.  The needle was placed perpendicular and the needle advanced 1.5 cm. After aspiration to ensure no obstruction or presence of blood, the area was injected.  The needle was repositioned in a fan-like manner and the entire area was injected. Pressure was held and no hematoma was noted.   He has had significant improvement in headaches with the addition of Emgality and baclofen. Will continue current regimen.  Plan: -Prevention: Continue Emgality 120 mg monthly -Rescue: Continue PRN baclofen, diclofenac  Genia Harold, MD 01/02/22 1:10 PM

## 2022-01-02 ENCOUNTER — Ambulatory Visit (INDEPENDENT_AMBULATORY_CARE_PROVIDER_SITE_OTHER): Payer: Medicare Other | Admitting: Psychiatry

## 2022-01-02 VITALS — BP 147/73 | HR 99

## 2022-01-02 DIAGNOSIS — M542 Cervicalgia: Secondary | ICD-10-CM

## 2022-01-14 DIAGNOSIS — M62830 Muscle spasm of back: Secondary | ICD-10-CM | POA: Diagnosis not present

## 2022-01-25 IMAGING — MR MR CERVICAL SPINE W/O CM
4 of 5 series · 27 of 48 positions shown · non-contrast
Comparison: Radiographs of the cervical spine 12/29/2020.

CLINICAL DATA: Provided history: Neck pain. Additional history
provided by scanning technologist: Patient reports neck pain
radiating into right arm, tingling in right hand, headaches,
symptoms for 2 years.

EXAM:
MRI CERVICAL SPINE WITHOUT CONTRAST
TECHNIQUE: Multiplanar, multisequence MR imaging of the cervical spine was
performed. No intravenous contrast was administered.

[Series 9: T2 · sagittal · 3.0mm · 0.55mm/px · 7 of 15 slices shown (1 of 2)]
[im 1/15]
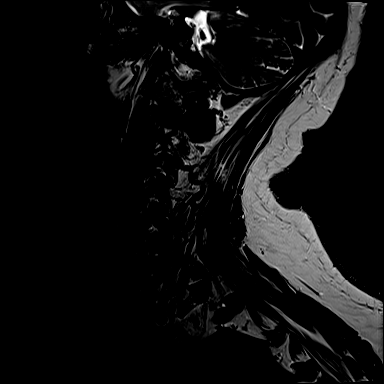
[im 3/15]
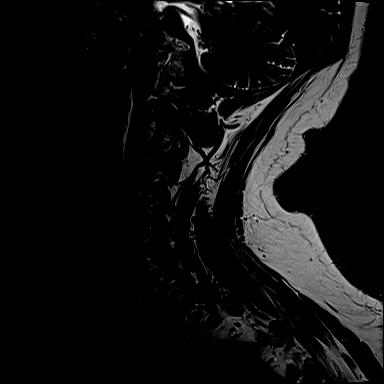
[im 5/15]
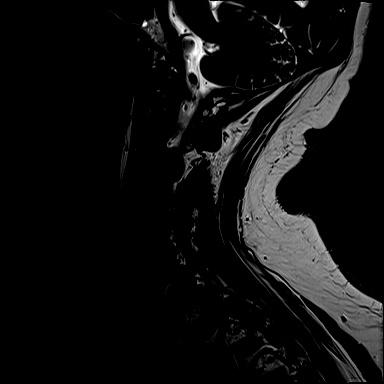
[im 8/15]
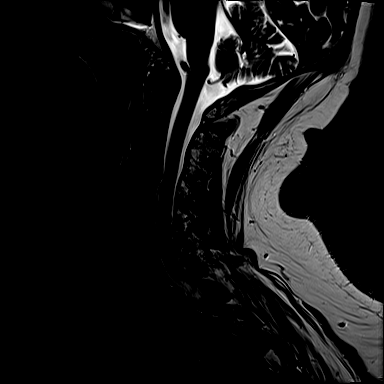
[im 10/15]
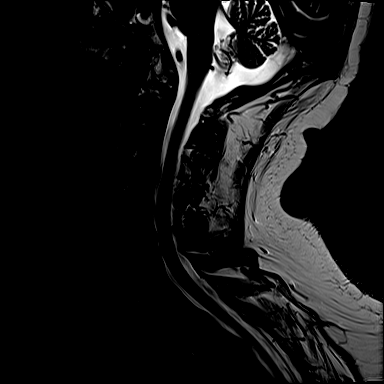
[im 12/15]
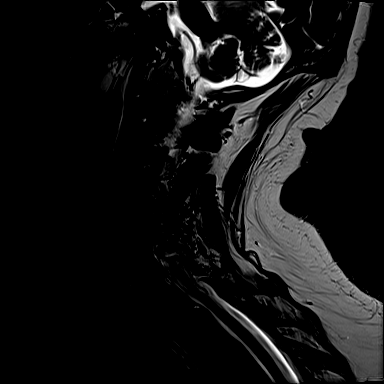
[im 15/15]
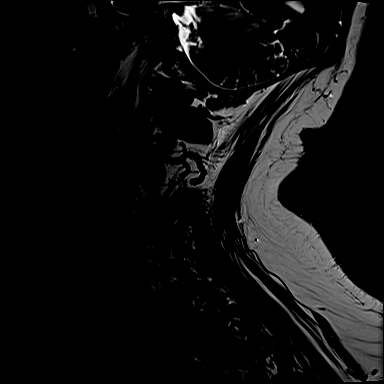

[Series 10: T1 · sagittal · 3.0mm · 0.66mm/px · 7 of 15 slices shown]
[im 1/15]
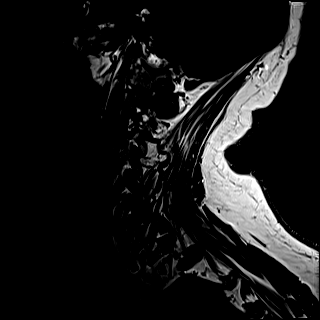
[im 3/15]
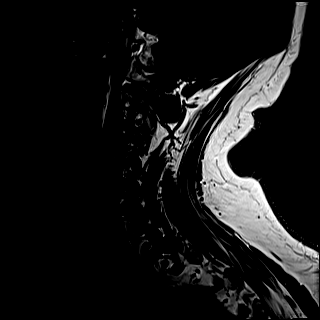
[im 5/15]
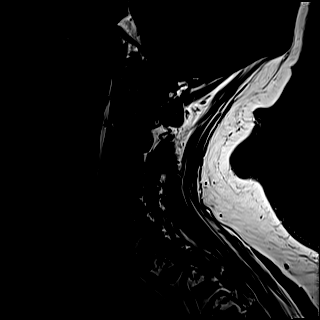
[im 8/15]
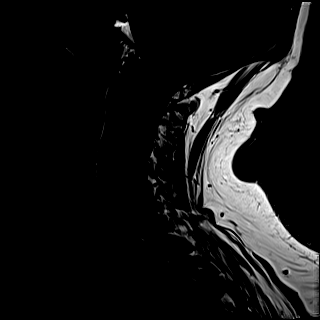
[im 10/15]
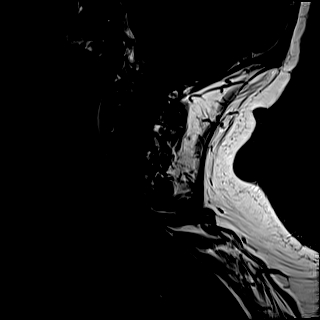
[im 12/15]
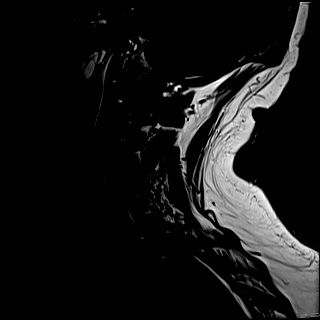
[im 15/15]
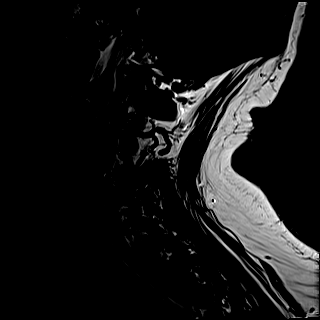

[Series 11: STIR · sagittal · 3.0mm · 0.33mm/px · 5 of 15 slices shown]
[im 1/15]
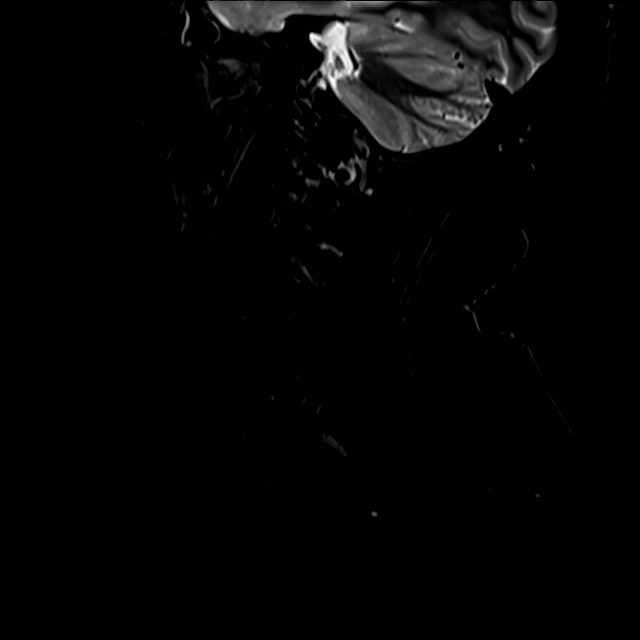
[im 3/15]
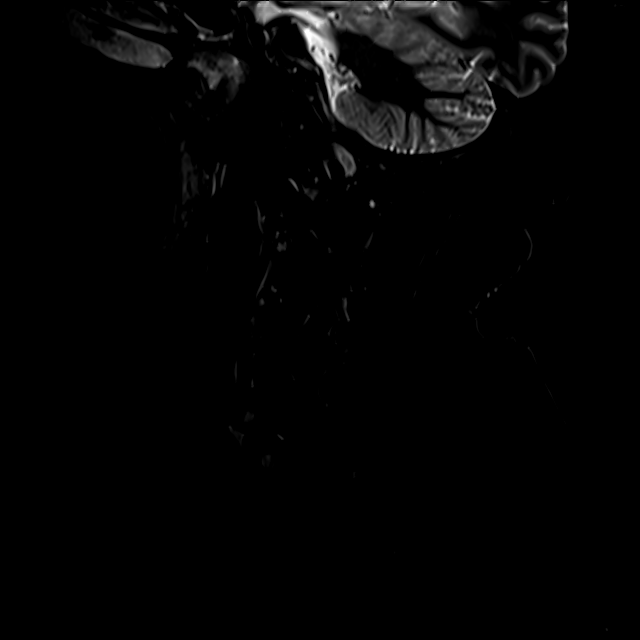
[im 6/15]
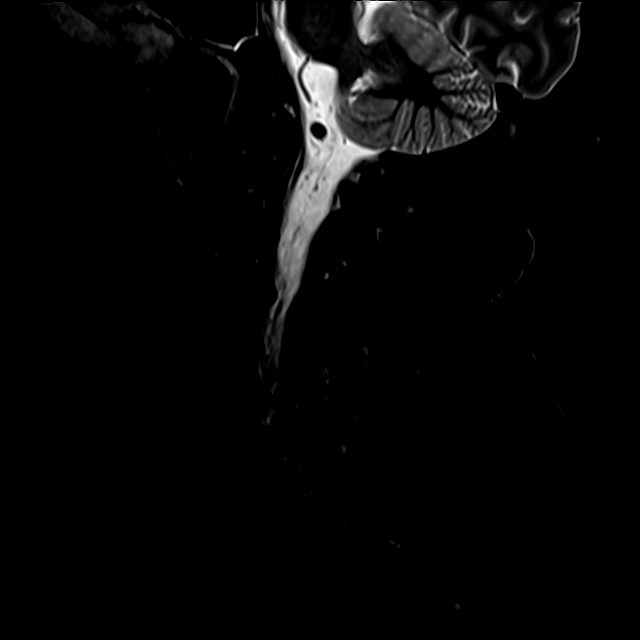
[im 9/15]
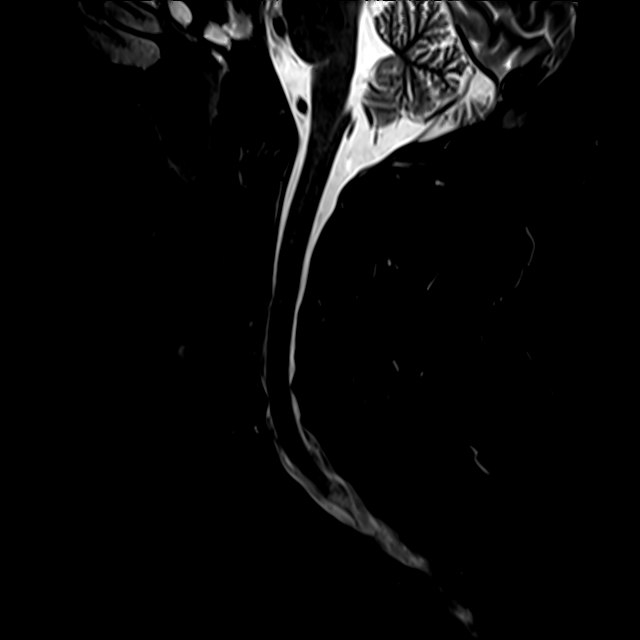
[im 15/15]
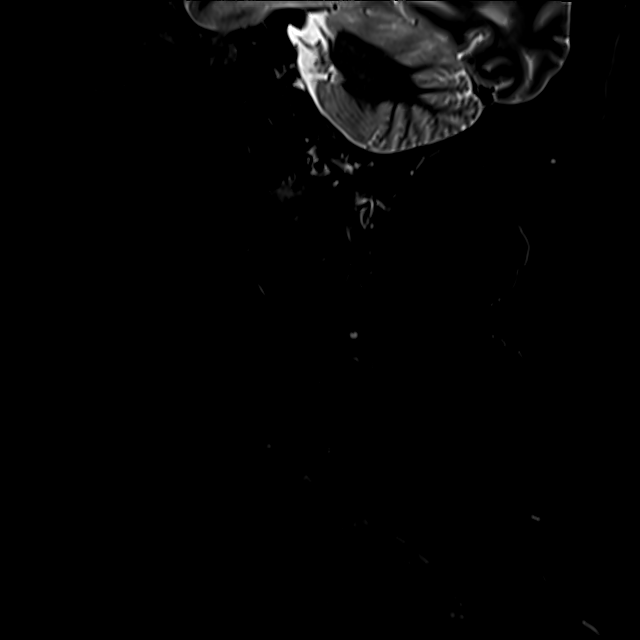

[Series 12: T2 · axial · 3.0mm · 0.50mm/px · z∈[-81,+27]mm · 8 of 34 slices shown (2 of 2)]
[im 1/34]
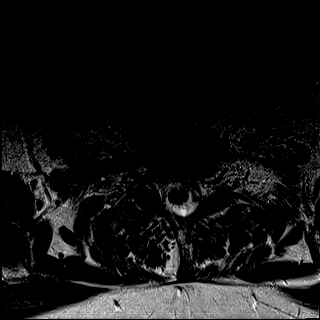
[im 6/34]
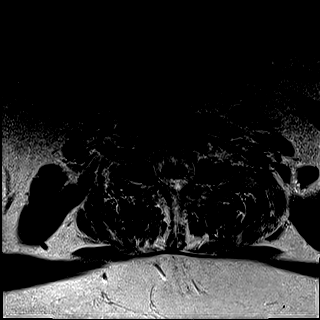
[im 11/34]
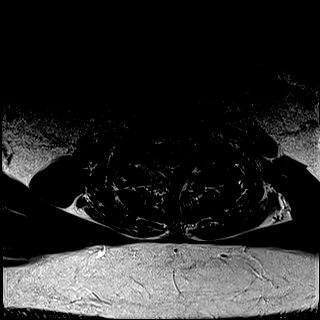
[im 16/34]
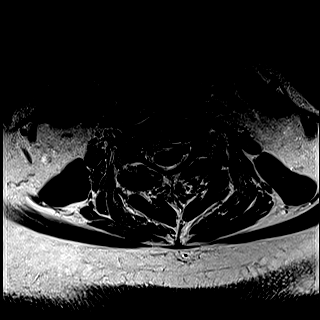
[im 18/34]
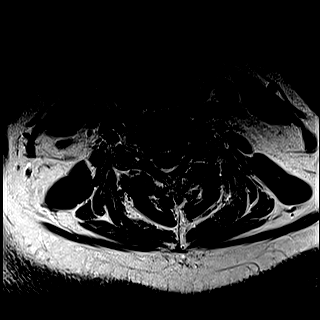
[im 23/34]
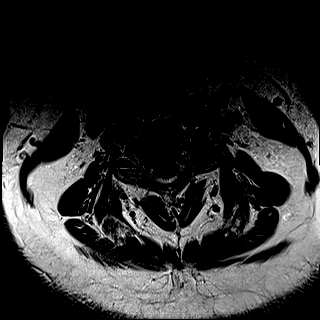
[im 28/34]
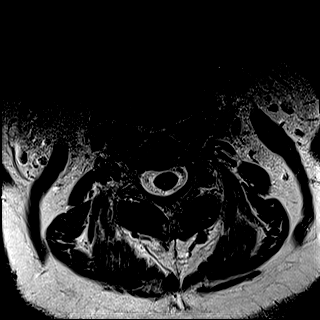
[im 34/34]
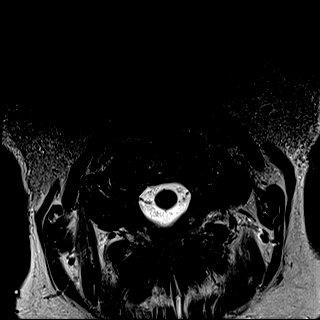

[27 of 48 positions shown; findings below may reference images not displayed]

FINDINGS: Mild intermittent motion degradation.

Alignment: Cervical levocurvature. Trace C3-C4 grade 1
retrolisthesis.

Vertebrae: Vertebral body height is maintained. No significant
marrow edema or focal suspicious osseous lesion.

Cord: No signal abnormality identified within the cervical spinal
cord.

Posterior Fossa, vertebral arteries, paraspinal tissues: No
abnormality identified within included portions of the posterior
fossa. Flow voids preserved within the imaged cervical vertebral
arteries. Paraspinal soft tissues unremarkable.

Disc levels:

No more than mild disc degeneration at any level.

C2-C3: Minimal facet arthrosis. No significant disc herniation or
stenosis.

C3-C4: Trace grade 1 retrolisthesis. Disc bulge. Right greater than
left uncovertebral hypertrophy. Mild facet arthrosis. Mild relative
spinal canal narrowing (without significant spinal cord mass
effect). Mild to moderate right neural foraminal narrowing.

C4-C5: Disc bulge. Bilateral uncovertebral hypertrophy. Mild facet
arthrosis. No significant spinal canal stenosis. Bilateral neural
foraminal narrowing (mild right, moderate left).

C5-C6: Disc bulge with bilateral disc osteophyte ridge/uncinate
hypertrophy. Facet arthrosis with ligamentum flavum buckling. Mild
spinal canal stenosis without significant spinal cord mass effect).
Severe bilateral neural foraminal narrowing.

C6-C7: Right center posterior annular fissure. Central disc
protrusion eccentric to the left. Facet arthrosis (greater on the
left). Slight ligamentum flavum buckling. No significant spinal
canal stenosis. Mild to moderate right neural foraminal narrowing.

C7-T1: Slight disc bulge. Mild facet arthrosis. No significant
spinal canal or foraminal stenosis.

T1-T2: Imaged sagittally. Small left center disc protrusion. No more
than mild relative spinal canal narrowing. The neural foramina are
incompletely assessed on sagittal imaging.
IMPRESSION: Cervical spondylosis, as outlined and with findings most notably as
follows.

At C5-C6, there is mild disc degeneration. Multifactorial mild
spinal canal stenosis (without significant spinal cord mass effect).
Severe bilateral neural foraminal narrowing.

No more than mild relative spinal canal narrowing at the remaining
levels. Additional sites of foraminal stenosis, as detailed and
greatest on the right at C3-C4 (mild-to-moderate), on the left at
C4-C5 (moderate) and on the right at C6-C7 (mild-to-moderate).

No more than mild disc degeneration within the cervical spine.

Cervical levocurvature.

Trace C3-C4 grade 1 retrolisthesis.

## 2022-01-29 DIAGNOSIS — U071 COVID-19: Secondary | ICD-10-CM | POA: Diagnosis not present

## 2022-02-19 DIAGNOSIS — Z20822 Contact with and (suspected) exposure to covid-19: Secondary | ICD-10-CM | POA: Diagnosis not present

## 2022-02-19 DIAGNOSIS — J45909 Unspecified asthma, uncomplicated: Secondary | ICD-10-CM | POA: Diagnosis not present

## 2022-02-19 DIAGNOSIS — J00 Acute nasopharyngitis [common cold]: Secondary | ICD-10-CM | POA: Diagnosis not present

## 2022-03-16 DIAGNOSIS — K859 Acute pancreatitis without necrosis or infection, unspecified: Secondary | ICD-10-CM | POA: Diagnosis not present

## 2022-03-16 DIAGNOSIS — I10 Essential (primary) hypertension: Secondary | ICD-10-CM | POA: Diagnosis not present

## 2022-03-16 DIAGNOSIS — R0789 Other chest pain: Secondary | ICD-10-CM | POA: Diagnosis not present

## 2022-03-16 DIAGNOSIS — R079 Chest pain, unspecified: Secondary | ICD-10-CM | POA: Diagnosis not present

## 2022-03-16 DIAGNOSIS — Z79899 Other long term (current) drug therapy: Secondary | ICD-10-CM | POA: Diagnosis not present

## 2022-03-16 DIAGNOSIS — Z87891 Personal history of nicotine dependence: Secondary | ICD-10-CM | POA: Diagnosis not present

## 2022-03-16 DIAGNOSIS — G8929 Other chronic pain: Secondary | ICD-10-CM | POA: Diagnosis not present

## 2022-03-16 DIAGNOSIS — M549 Dorsalgia, unspecified: Secondary | ICD-10-CM | POA: Diagnosis not present

## 2022-03-16 DIAGNOSIS — R1013 Epigastric pain: Secondary | ICD-10-CM | POA: Diagnosis not present

## 2022-03-16 DIAGNOSIS — R110 Nausea: Secondary | ICD-10-CM | POA: Diagnosis not present

## 2022-03-16 DIAGNOSIS — R9431 Abnormal electrocardiogram [ECG] [EKG]: Secondary | ICD-10-CM | POA: Diagnosis not present

## 2022-03-16 DIAGNOSIS — Z7982 Long term (current) use of aspirin: Secondary | ICD-10-CM | POA: Diagnosis not present

## 2022-03-18 DIAGNOSIS — K859 Acute pancreatitis without necrosis or infection, unspecified: Secondary | ICD-10-CM | POA: Diagnosis not present

## 2022-03-18 DIAGNOSIS — D379 Neoplasm of uncertain behavior of digestive organ, unspecified: Secondary | ICD-10-CM | POA: Diagnosis not present

## 2022-03-18 DIAGNOSIS — Z1211 Encounter for screening for malignant neoplasm of colon: Secondary | ICD-10-CM | POA: Diagnosis not present

## 2022-03-18 DIAGNOSIS — R1013 Epigastric pain: Secondary | ICD-10-CM | POA: Diagnosis not present

## 2022-03-19 DIAGNOSIS — K859 Acute pancreatitis without necrosis or infection, unspecified: Secondary | ICD-10-CM | POA: Diagnosis not present

## 2022-03-19 DIAGNOSIS — D379 Neoplasm of uncertain behavior of digestive organ, unspecified: Secondary | ICD-10-CM | POA: Diagnosis not present

## 2022-03-19 DIAGNOSIS — R1013 Epigastric pain: Secondary | ICD-10-CM | POA: Diagnosis not present

## 2022-03-22 DIAGNOSIS — K859 Acute pancreatitis without necrosis or infection, unspecified: Secondary | ICD-10-CM | POA: Diagnosis not present

## 2022-03-28 DIAGNOSIS — R1013 Epigastric pain: Secondary | ICD-10-CM | POA: Diagnosis not present

## 2022-03-28 DIAGNOSIS — Z538 Procedure and treatment not carried out for other reasons: Secondary | ICD-10-CM | POA: Diagnosis not present

## 2022-03-28 DIAGNOSIS — Z1211 Encounter for screening for malignant neoplasm of colon: Secondary | ICD-10-CM | POA: Diagnosis not present

## 2022-03-28 DIAGNOSIS — K2289 Other specified disease of esophagus: Secondary | ICD-10-CM | POA: Diagnosis not present

## 2022-03-28 DIAGNOSIS — J45909 Unspecified asthma, uncomplicated: Secondary | ICD-10-CM | POA: Diagnosis not present

## 2022-03-28 DIAGNOSIS — K3189 Other diseases of stomach and duodenum: Secondary | ICD-10-CM | POA: Diagnosis not present

## 2022-03-31 DIAGNOSIS — G8929 Other chronic pain: Secondary | ICD-10-CM | POA: Diagnosis not present

## 2022-03-31 DIAGNOSIS — E7800 Pure hypercholesterolemia, unspecified: Secondary | ICD-10-CM | POA: Diagnosis not present

## 2022-03-31 DIAGNOSIS — M545 Low back pain, unspecified: Secondary | ICD-10-CM | POA: Diagnosis not present

## 2022-03-31 DIAGNOSIS — R7303 Prediabetes: Secondary | ICD-10-CM | POA: Diagnosis not present

## 2022-03-31 DIAGNOSIS — G4700 Insomnia, unspecified: Secondary | ICD-10-CM | POA: Diagnosis not present

## 2022-03-31 DIAGNOSIS — Z8673 Personal history of transient ischemic attack (TIA), and cerebral infarction without residual deficits: Secondary | ICD-10-CM | POA: Diagnosis not present

## 2022-04-01 DIAGNOSIS — K621 Rectal polyp: Secondary | ICD-10-CM | POA: Diagnosis not present

## 2022-04-01 DIAGNOSIS — J40 Bronchitis, not specified as acute or chronic: Secondary | ICD-10-CM | POA: Diagnosis not present

## 2022-04-01 DIAGNOSIS — K5730 Diverticulosis of large intestine without perforation or abscess without bleeding: Secondary | ICD-10-CM | POA: Diagnosis not present

## 2022-04-01 DIAGNOSIS — Z1211 Encounter for screening for malignant neoplasm of colon: Secondary | ICD-10-CM | POA: Diagnosis not present

## 2022-04-01 DIAGNOSIS — D128 Benign neoplasm of rectum: Secondary | ICD-10-CM | POA: Diagnosis not present

## 2022-04-02 NOTE — Progress Notes (Unsigned)
Procedure: Occipital Nerve injection/Trigger point injection  Location: bilateral occiput  The risks, benefits and anticipated outcomes of the procedure, the risks and benefits of the alternatives to the procedure, and the roles and tasks of the personnel to be involved, were discussed with the patient, and the patient consents to the procedure and agrees to proceed.     A combination of 1 cc betamethasone 6 mg and 4 cc of 0.25% bupivacaine were prepared in 2 syringes (5 cc).  2 trigger points on the splenius capitus were identified and injected. The left and right greater occipital nerves were injected 3cm caudal and 1.5 cm lateral to the inion where the main trunk of the occipital nerve penetrates the semispinalis muscle.  The needle was placed perpendicular and the needle advanced 1.5 cm. After aspiration to ensure no obstruction or presence of blood, the area was injected.  The needle was repositioned in a fan-like manner and the entire area was injected. Pressure was held and no hematoma was noted.   Plan: -Prevention: Continue Emgality 120 mg monthly -Rescue: Continue PRN baclofen, diclofenac  Genia Harold, MD 04/03/22 10:01 AM

## 2022-04-03 ENCOUNTER — Ambulatory Visit (INDEPENDENT_AMBULATORY_CARE_PROVIDER_SITE_OTHER): Payer: Medicare Other | Admitting: Psychiatry

## 2022-04-03 VITALS — BP 124/78 | HR 97 | Ht 70.5 in | Wt 235.4 lb

## 2022-04-03 DIAGNOSIS — M5481 Occipital neuralgia: Secondary | ICD-10-CM

## 2022-04-03 DIAGNOSIS — M542 Cervicalgia: Secondary | ICD-10-CM | POA: Diagnosis not present

## 2022-04-03 MED ORDER — BACLOFEN 10 MG PO TABS: 10.0000 mg | ORAL_TABLET | Freq: Three times a day (TID) | ORAL | 6 refills | Status: DC | PRN

## 2022-04-03 MED ORDER — DICLOFENAC POTASSIUM 50 MG PO TABS: ORAL_TABLET | ORAL | 3 refills | Status: DC

## 2022-04-03 MED ORDER — EMGALITY 120 MG/ML ~~LOC~~ SOAJ: 1.0000 | SUBCUTANEOUS | 6 refills | Status: AC

## 2022-04-03 NOTE — Patient Instructions (Signed)
Please check [email or web address] for financial assistance for [employer]

## 2022-04-15 ENCOUNTER — Ambulatory Visit: Payer: Medicare Other | Admitting: Psychiatry

## 2022-06-12 ENCOUNTER — Ambulatory Visit: Payer: Medicare Other | Admitting: Psychiatry

## 2022-06-12 ENCOUNTER — Encounter: Payer: Self-pay | Admitting: Psychiatry

## 2022-06-12 VITALS — BP 148/84 | HR 64 | Ht 70.0 in | Wt 239.0 lb

## 2022-06-12 DIAGNOSIS — M542 Cervicalgia: Secondary | ICD-10-CM

## 2022-06-12 MED ORDER — DICLOFENAC POTASSIUM 50 MG PO TABS: ORAL_TABLET | ORAL | 6 refills | Status: AC

## 2022-06-12 MED ORDER — BETAMETHASONE SOD PHOS & ACET 6 (3-3) MG/ML IJ SUSP
12.0000 mg | Freq: Once | INTRAMUSCULAR | Status: AC
  Administered 2022-06-12: 12 mg via INTRAMUSCULAR

## 2022-06-12 MED ORDER — BACLOFEN 10 MG PO TABS: 10.0000 mg | ORAL_TABLET | Freq: Three times a day (TID) | ORAL | 6 refills | Status: AC | PRN

## 2022-06-12 MED ORDER — BUPIVACAINE HCL (PF) 0.25 % IJ SOLN
8.0000 mL | Freq: Once | INTRAMUSCULAR | Status: AC
  Administered 2022-06-12: 8 mL

## 2022-06-12 NOTE — Progress Notes (Signed)
Procedure: Occipital Nerve injection/Trigger point injection  Location: bilateral occiput  The risks, benefits and anticipated outcomes of the procedure, the risks and benefits of the alternatives to the procedure, and the roles and tasks of the personnel to be involved, were discussed with the patient, and the patient consents to the procedure and agrees to proceed.     A combination of 1 cc betamethasone 6 mg and 4 cc of 0.25% bupivacaine were prepared in 2 syringes (5 cc).  2 trigger points on the splenius capitus were identified and injected. The bilateral greater occipital nerves were injected 3cm caudal and 1.5 cm lateral to the inion where the main trunk of the occipital nerve penetrates the semispinalis muscle.  The needle was placed perpendicular and the needle advanced 1.5 cm. After aspiration to ensure no obstruction or presence of blood, the area was injected. The needle was repositioned in a fan-like manner and the entire area was injected. Pressure was held and no hematoma was noted.   Ocie Doyne, MD 06/12/22  1cc Betamethasone Lot: [id number]   Exp: 12/18/2022   4cc 0.25 Bupivacaine Lot: [id number] Exp: 09/25/2022

## 2022-06-12 NOTE — Patient Instructions (Addendum)
Email: [email or web address]  Fax number: [phone]

## 2022-09-30 DIAGNOSIS — M545 Low back pain, unspecified: Secondary | ICD-10-CM | POA: Diagnosis not present

## 2022-09-30 DIAGNOSIS — G8929 Other chronic pain: Secondary | ICD-10-CM | POA: Diagnosis not present

## 2022-09-30 DIAGNOSIS — R7303 Prediabetes: Secondary | ICD-10-CM | POA: Diagnosis not present

## 2022-09-30 DIAGNOSIS — G4700 Insomnia, unspecified: Secondary | ICD-10-CM | POA: Diagnosis not present

## 2022-09-30 NOTE — Progress Notes (Unsigned)
Procedure: Occipital Nerve injection/Trigger point injection  Location: bilateral occiput  The risks, benefits and anticipated outcomes of the procedure, the risks and benefits of the alternatives to the procedure, and the roles and tasks of the personnel to be involved, were discussed with the patient, and the patient consents to the procedure and agrees to proceed.     A combination of 1 cc betamethasone 6 mg and 4 cc of 0.25% bupivacaine were prepared in 2 syringes (5 cc).  2 trigger points on the splenius capitus were identified and injected. The left and right greater occipital nerves were injected 3cm caudal and 1.5 cm lateral to the inion where the main trunk of the occipital nerve penetrates the semispinalis muscle.  The needle was placed perpendicular and the needle advanced 1.5 cm. After aspiration to ensure no obstruction or presence of blood, the area was injected.  The needle was repositioned in a fan-like manner and the entire area was injected. Pressure was held and no hematoma was noted.   Scott Doyne, MD 10/01/22 10:54 AM

## 2022-10-01 ENCOUNTER — Ambulatory Visit (INDEPENDENT_AMBULATORY_CARE_PROVIDER_SITE_OTHER): Payer: Medicare Other | Admitting: Psychiatry

## 2022-10-01 DIAGNOSIS — M542 Cervicalgia: Secondary | ICD-10-CM

## 2022-10-01 DIAGNOSIS — G43019 Migraine without aura, intractable, without status migrainosus: Secondary | ICD-10-CM

## 2022-10-01 MED ORDER — BUPIVACAINE HCL (PF) 0.25 % IJ SOLN
8.0000 mL | Freq: Once | INTRAMUSCULAR | Status: AC
  Administered 2022-10-01: 8 mL

## 2022-10-01 MED ORDER — BETAMETHASONE SOD PHOS & ACET 6 (3-3) MG/ML IJ SUSP
30.0000 mg | Freq: Once | INTRAMUSCULAR | Status: AC
  Administered 2022-10-01: 30 mg via INTRAMUSCULAR

## 2022-10-04 ENCOUNTER — Encounter: Payer: Self-pay | Admitting: Psychiatry

## 2022-10-06 ENCOUNTER — Telehealth: Payer: Self-pay | Admitting: Psychiatry

## 2022-10-06 NOTE — Telephone Encounter (Signed)
Referral for neurology fax to [facility]. Phone: [phone], Fax: [phone].

## 2022-10-14 DIAGNOSIS — G43719 Chronic migraine without aura, intractable, without status migrainosus: Secondary | ICD-10-CM | POA: Diagnosis not present

## 2022-10-14 DIAGNOSIS — Z049 Encounter for examination and observation for unspecified reason: Secondary | ICD-10-CM | POA: Diagnosis not present

## 2022-10-14 DIAGNOSIS — Z79899 Other long term (current) drug therapy: Secondary | ICD-10-CM | POA: Diagnosis not present

## 2022-10-28 DIAGNOSIS — M791 Myalgia, unspecified site: Secondary | ICD-10-CM | POA: Diagnosis not present

## 2022-10-28 DIAGNOSIS — G43719 Chronic migraine without aura, intractable, without status migrainosus: Secondary | ICD-10-CM | POA: Diagnosis not present

## 2022-10-28 DIAGNOSIS — M542 Cervicalgia: Secondary | ICD-10-CM | POA: Diagnosis not present

## 2022-11-18 DIAGNOSIS — M542 Cervicalgia: Secondary | ICD-10-CM | POA: Diagnosis not present

## 2022-11-18 DIAGNOSIS — G43719 Chronic migraine without aura, intractable, without status migrainosus: Secondary | ICD-10-CM | POA: Diagnosis not present

## 2022-11-18 DIAGNOSIS — M791 Myalgia, unspecified site: Secondary | ICD-10-CM | POA: Diagnosis not present

## 2022-12-02 DIAGNOSIS — M791 Myalgia, unspecified site: Secondary | ICD-10-CM | POA: Diagnosis not present

## 2022-12-02 DIAGNOSIS — G43719 Chronic migraine without aura, intractable, without status migrainosus: Secondary | ICD-10-CM | POA: Diagnosis not present

## 2022-12-02 DIAGNOSIS — M542 Cervicalgia: Secondary | ICD-10-CM | POA: Diagnosis not present

## 2023-01-01 DIAGNOSIS — Z8673 Personal history of transient ischemic attack (TIA), and cerebral infarction without residual deficits: Secondary | ICD-10-CM | POA: Diagnosis not present

## 2023-01-01 DIAGNOSIS — Z23 Encounter for immunization: Secondary | ICD-10-CM | POA: Diagnosis not present

## 2023-01-01 DIAGNOSIS — M5481 Occipital neuralgia: Secondary | ICD-10-CM | POA: Diagnosis not present

## 2023-01-13 DIAGNOSIS — Z0000 Encounter for general adult medical examination without abnormal findings: Secondary | ICD-10-CM | POA: Diagnosis not present

## 2023-01-13 DIAGNOSIS — Z9181 History of falling: Secondary | ICD-10-CM | POA: Diagnosis not present

## 2023-01-13 DIAGNOSIS — Z139 Encounter for screening, unspecified: Secondary | ICD-10-CM | POA: Diagnosis not present

## 2023-01-21 DIAGNOSIS — M5481 Occipital neuralgia: Secondary | ICD-10-CM | POA: Diagnosis not present

## 2023-01-21 DIAGNOSIS — M542 Cervicalgia: Secondary | ICD-10-CM | POA: Diagnosis not present

## 2023-01-21 DIAGNOSIS — Z1389 Encounter for screening for other disorder: Secondary | ICD-10-CM | POA: Diagnosis not present

## 2023-02-03 DIAGNOSIS — M5481 Occipital neuralgia: Secondary | ICD-10-CM | POA: Diagnosis not present

## 2023-04-15 DIAGNOSIS — M545 Low back pain, unspecified: Secondary | ICD-10-CM | POA: Diagnosis not present

## 2023-04-15 DIAGNOSIS — G4700 Insomnia, unspecified: Secondary | ICD-10-CM | POA: Diagnosis not present

## 2023-04-15 DIAGNOSIS — R7303 Prediabetes: Secondary | ICD-10-CM | POA: Diagnosis not present

## 2023-04-15 DIAGNOSIS — E7800 Pure hypercholesterolemia, unspecified: Secondary | ICD-10-CM | POA: Diagnosis not present

## 2023-04-15 DIAGNOSIS — G8929 Other chronic pain: Secondary | ICD-10-CM | POA: Diagnosis not present

## 2023-07-28 DIAGNOSIS — R413 Other amnesia: Secondary | ICD-10-CM | POA: Diagnosis not present

## 2023-07-28 DIAGNOSIS — M545 Low back pain, unspecified: Secondary | ICD-10-CM | POA: Diagnosis not present

## 2023-07-28 DIAGNOSIS — G8929 Other chronic pain: Secondary | ICD-10-CM | POA: Diagnosis not present

## 2023-07-28 DIAGNOSIS — I6522 Occlusion and stenosis of left carotid artery: Secondary | ICD-10-CM | POA: Diagnosis not present

## 2023-09-03 DIAGNOSIS — R413 Other amnesia: Secondary | ICD-10-CM | POA: Diagnosis not present

## 2023-09-03 DIAGNOSIS — I6782 Cerebral ischemia: Secondary | ICD-10-CM | POA: Diagnosis not present

## 2023-09-03 DIAGNOSIS — I639 Cerebral infarction, unspecified: Secondary | ICD-10-CM | POA: Diagnosis not present

## 2023-09-03 DIAGNOSIS — R9089 Other abnormal findings on diagnostic imaging of central nervous system: Secondary | ICD-10-CM | POA: Diagnosis not present

## 2023-09-16 DIAGNOSIS — M545 Low back pain, unspecified: Secondary | ICD-10-CM | POA: Diagnosis not present

## 2023-09-17 DIAGNOSIS — I6522 Occlusion and stenosis of left carotid artery: Secondary | ICD-10-CM | POA: Diagnosis not present

## 2023-09-28 DIAGNOSIS — M25551 Pain in right hip: Secondary | ICD-10-CM | POA: Diagnosis not present

## 2023-09-28 DIAGNOSIS — M4696 Unspecified inflammatory spondylopathy, lumbar region: Secondary | ICD-10-CM | POA: Diagnosis not present

## 2023-09-28 DIAGNOSIS — M545 Low back pain, unspecified: Secondary | ICD-10-CM | POA: Diagnosis not present

## 2023-09-29 ENCOUNTER — Other Ambulatory Visit: Payer: Self-pay | Admitting: Orthopedic Surgery

## 2023-09-29 DIAGNOSIS — M5416 Radiculopathy, lumbar region: Secondary | ICD-10-CM

## 2023-10-05 DIAGNOSIS — M47816 Spondylosis without myelopathy or radiculopathy, lumbar region: Secondary | ICD-10-CM | POA: Diagnosis not present

## 2023-10-06 ENCOUNTER — Ambulatory Visit
Admission: RE | Admit: 2023-10-06 | Discharge: 2023-10-06 | Disposition: A | Discharge status: Home / Self Care | Source: Ambulatory Visit | Attending: Orthopedic Surgery | Admitting: Orthopedic Surgery

## 2023-10-06 DIAGNOSIS — M5127 Other intervertebral disc displacement, lumbosacral region: Secondary | ICD-10-CM | POA: Diagnosis not present

## 2023-10-06 DIAGNOSIS — M5416 Radiculopathy, lumbar region: Secondary | ICD-10-CM

## 2023-10-07 DIAGNOSIS — M47816 Spondylosis without myelopathy or radiculopathy, lumbar region: Secondary | ICD-10-CM | POA: Diagnosis not present

## 2023-10-15 DIAGNOSIS — M545 Low back pain, unspecified: Secondary | ICD-10-CM | POA: Diagnosis not present

## 2023-10-15 DIAGNOSIS — G8929 Other chronic pain: Secondary | ICD-10-CM | POA: Diagnosis not present

## 2023-10-15 DIAGNOSIS — R7303 Prediabetes: Secondary | ICD-10-CM | POA: Diagnosis not present

## 2023-10-15 DIAGNOSIS — Z23 Encounter for immunization: Secondary | ICD-10-CM | POA: Diagnosis not present

## 2023-10-15 DIAGNOSIS — G4700 Insomnia, unspecified: Secondary | ICD-10-CM | POA: Diagnosis not present

## 2023-10-15 DIAGNOSIS — E7800 Pure hypercholesterolemia, unspecified: Secondary | ICD-10-CM | POA: Diagnosis not present

## 2023-10-30 DIAGNOSIS — M545 Low back pain, unspecified: Secondary | ICD-10-CM | POA: Diagnosis not present

## 2023-11-04 DIAGNOSIS — M545 Low back pain, unspecified: Secondary | ICD-10-CM | POA: Diagnosis not present

## 2023-11-04 DIAGNOSIS — M47816 Spondylosis without myelopathy or radiculopathy, lumbar region: Secondary | ICD-10-CM | POA: Diagnosis not present

## 2023-11-04 DIAGNOSIS — M5126 Other intervertebral disc displacement, lumbar region: Secondary | ICD-10-CM | POA: Diagnosis not present

## 2023-11-04 DIAGNOSIS — M48062 Spinal stenosis, lumbar region with neurogenic claudication: Secondary | ICD-10-CM | POA: Diagnosis not present

## 2023-11-12 DIAGNOSIS — M47816 Spondylosis without myelopathy or radiculopathy, lumbar region: Secondary | ICD-10-CM | POA: Diagnosis not present

## 2023-11-27 DIAGNOSIS — M47816 Spondylosis without myelopathy or radiculopathy, lumbar region: Secondary | ICD-10-CM | POA: Diagnosis not present

## 2023-12-10 DIAGNOSIS — M47816 Spondylosis without myelopathy or radiculopathy, lumbar region: Secondary | ICD-10-CM | POA: Diagnosis not present
